# Patient Record
Sex: Male | Born: 1970 | Race: White | Hispanic: No | Marital: Married | Smoking: Never smoker
Health system: Southern US, Community
[De-identification: ages and names within clinical notes are randomized; demographics above are authoritative.]

## PROBLEM LIST (undated history)

## (undated) DIAGNOSIS — Z789 Other specified health status: Secondary | ICD-10-CM

## (undated) HISTORY — PX: WISDOM TOOTH EXTRACTION: SHX21

---

## 2018-11-11 DIAGNOSIS — J988 Other specified respiratory disorders: Secondary | ICD-10-CM | POA: Diagnosis not present

## 2018-11-11 DIAGNOSIS — Z20828 Contact with and (suspected) exposure to other viral communicable diseases: Secondary | ICD-10-CM | POA: Diagnosis not present

## 2019-06-28 DIAGNOSIS — H6121 Impacted cerumen, right ear: Secondary | ICD-10-CM | POA: Diagnosis not present

## 2019-09-30 ENCOUNTER — Ambulatory Visit (HOSPITAL_COMMUNITY)
Admission: EM | Admit: 2019-09-30 | Discharge: 2019-09-30 | Disposition: A | Discharge status: Home / Self Care | Payer: BC Managed Care – PPO | Attending: Emergency Medicine | Admitting: Emergency Medicine

## 2019-09-30 ENCOUNTER — Other Ambulatory Visit: Payer: Self-pay

## 2019-09-30 ENCOUNTER — Encounter (HOSPITAL_COMMUNITY): Payer: Self-pay

## 2019-09-30 DIAGNOSIS — J029 Acute pharyngitis, unspecified: Secondary | ICD-10-CM | POA: Insufficient documentation

## 2019-09-30 DIAGNOSIS — Z20822 Contact with and (suspected) exposure to covid-19: Secondary | ICD-10-CM | POA: Insufficient documentation

## 2019-09-30 DIAGNOSIS — R1013 Epigastric pain: Secondary | ICD-10-CM | POA: Diagnosis not present

## 2019-09-30 DIAGNOSIS — R0981 Nasal congestion: Secondary | ICD-10-CM

## 2019-09-30 LAB — COMPREHENSIVE METABOLIC PANEL
ALT: 20 U/L (ref 0–44)
AST: 21 U/L (ref 15–41)
Albumin: 4.2 g/dL (ref 3.5–5.0)
Alkaline Phosphatase: 61 U/L (ref 38–126)
Anion gap: 8 (ref 5–15)
BUN: 15 mg/dL (ref 6–20)
CO2: 23 mmol/L (ref 22–32)
Calcium: 9.2 mg/dL (ref 8.9–10.3)
Chloride: 107 mmol/L (ref 98–111)
Creatinine, Ser: 0.68 mg/dL (ref 0.61–1.24)
GFR calc Af Amer: >60 mL/min (ref >60)
GFR calc non Af Amer: >60 mL/min (ref >60)
Glucose, Bld: 97 mg/dL (ref 70–99)
Potassium: 3.9 mmol/L (ref 3.5–5.1)
Sodium: 138 mmol/L (ref 135–145)
Total Bilirubin: 1.1 mg/dL (ref 0.3–1.2)
Total Protein: 7.5 g/dL (ref 6.5–8.1)

## 2019-09-30 LAB — CBC
HCT: 42.4 % (ref 39.0–52.0)
Hemoglobin: 14.2 g/dL (ref 13.0–17.0)
MCH: 27.7 pg (ref 26.0–34.0)
MCHC: 33.5 g/dL (ref 30.0–36.0)
MCV: 82.8 fL (ref 80.0–100.0)
Platelets: 258 10*3/uL (ref 150–400)
RBC: 5.12 MIL/uL (ref 4.22–5.81)
RDW: 11.7 % (ref 11.5–15.5)
WBC: 9.1 10*3/uL (ref 4.0–10.5)
nRBC: 0 % (ref 0.0–0.2)

## 2019-09-30 LAB — LIPASE, BLOOD: Lipase: 29 U/L (ref 11–51)

## 2019-09-30 LAB — SARS CORONAVIRUS 2 (TAT 6-24 HRS): SARS Coronavirus 2: NEGATIVE

## 2019-09-30 MED ORDER — OMEPRAZOLE 20 MG PO CPDR
20.0000 mg | DELAYED_RELEASE_CAPSULE | Freq: Two times a day (BID) | ORAL | 0 refills | Status: DC
Start: 2019-09-30 — End: 2019-10-24

## 2019-09-30 MED ORDER — ALUM & MAG HYDROXIDE-SIMETH 400-400-40 MG/5ML PO SUSP: 15.0000 mL | Freq: Four times a day (QID) | ORAL | 0 refills | Status: DC | PRN

## 2019-09-30 MED ORDER — DICYCLOMINE HCL 20 MG PO TABS
20.0000 mg | ORAL_TABLET | Freq: Three times a day (TID) | ORAL | 0 refills | Status: DC
Start: 2019-09-30 — End: 2021-06-10

## 2019-09-30 NOTE — Discharge Instructions (Addendum)
Blood work pending- I will call only if abnormal Begin omeprazole twice daily for the next 2 weeks for acid prevention Supplement with Maalox or Pepcid over-the-counter for more immediate symptom relief May also try Bentyl before meals and bedtime to help with cramping/pain  If symptoms persisting I recommend you following up with primary care to have ultrasound of your abdomen to further evaluate your gallbladder  If symptoms worsening, not easing off, developing fever, please go to emergency room for more emergent imaging

## 2019-09-30 NOTE — ED Triage Notes (Signed)
Patient reports upper abdominal pain, which gets worse laying on his right side, sore throat, nasal congestion, and vomiting x3 days. Reports vomiting makes him feel better.

## 2019-10-01 NOTE — ED Provider Notes (Signed)
[facility]    CSN: [id number] Arrival date & time: 09/30/19  1014      History   Chief Complaint Chief Complaint  Patient presents with  . Abdominal Pain  . Nasal Congestion  . Sore Throat  . Emesis    HPI Ian Ramirez is a 49 y.o. male today does not past medical history presenting today for evaluation of abdominal pain.  Patient states that beginning Tuesday he began to have discomfort in his upper abdomen.  He initially believed this to be indigestion or gas.  He was feeling his symptoms worse after eating and having a bloating sensation.  Symptoms are slightly more prominent on the right side.  He has also felt nauseous and has felt the need to force himself to vomit.  Feels better after vomiting.  He denies any diarrhea or change in bowel movements.  Reports that the pain has woken him up.  Does admit he feels his symptoms are worse after eating fatty/greasy foods.  He does report some mild associated URI symptoms such as sore throat and some congestion, but he has attributed this to his allergies and does not feel this is related to his symptoms in the stomach.  Currently without pain.  HPI  History reviewed. No pertinent past medical history.  There are no problems to display for this patient.   History reviewed. No pertinent surgical history.     Home Medications    Prior to Admission medications   Medication Sig Start Date End Date Taking? Authorizing Provider  alum & mag hydroxide-simeth (MAALOX PLUS) 400-400-40 MG/5ML suspension Take 15 mLs by mouth every 6 (six) hours as needed for indigestion. 09/30/19   Duane Trias C, PA-C  dicyclomine (BENTYL) 20 MG tablet Take 1 tablet (20 mg total) by mouth 4 (four) times daily -  before meals and at bedtime. 09/30/19   Analisia Kingsford C, PA-C  omeprazole (PRILOSEC) 20 MG capsule Take 1 capsule (20 mg total) by mouth 2 (two) times daily before a meal for 15 days. 09/30/19 10/15/19  Sahithi Ordoyne, Elesa Hacker, PA-C    Family  History No family history on file.  Social History Social History   Tobacco Use  . Smoking status: Never Smoker  . Smokeless tobacco: Never Used  Substance Use Topics  . Alcohol use: Yes    Alcohol/week: 1.0 standard drinks    Types: 1 Standard drinks or equivalent per week  . Drug use: Not on file     Allergies   Patient has no known allergies.   Review of Systems Review of Systems  Constitutional: Negative for activity change, appetite change, chills, fatigue and fever.  HENT: Positive for congestion, rhinorrhea and sore throat. Negative for ear pain, sinus pressure and trouble swallowing.   Eyes: Negative for discharge and redness.  Respiratory: Negative for cough, chest tightness and shortness of breath.   Cardiovascular: Negative for chest pain.  Gastrointestinal: Positive for abdominal pain, nausea and vomiting. Negative for diarrhea.  Musculoskeletal: Negative for myalgias.  Skin: Negative for rash.  Neurological: Negative for dizziness, light-headedness and headaches.     Physical Exam Triage Vital Signs ED Triage Vitals  Enc Vitals Group     BP 09/30/19 1053 134/84     Pulse Rate 09/30/19 1053 75     Resp 09/30/19 1053 16     Temp 09/30/19 1053 98.5 F (36.9 C)     Temp Source 09/30/19 1053 Oral     SpO2 09/30/19 1053 99 %  Weight --      Height --      Head Circumference --      Peak Flow --      Pain Score 09/30/19 1052 0     Pain Loc --      Pain Edu? --      Excl. in GC? --    No data found.  Updated Vital Signs BP 134/84 (BP Location: Left Arm)   Pulse 75   Temp 98.5 F (36.9 C) (Oral)   Resp 16   SpO2 99%   Visual Acuity Right Eye Distance:   Left Eye Distance:   Bilateral Distance:    Right Eye Near:   Left Eye Near:    Bilateral Near:     Physical Exam Vitals and nursing note reviewed.  Constitutional:      Appearance: He is well-developed.     Comments: No acute distress  HENT:     Head: Normocephalic and atraumatic.      Nose: Nose normal.     Mouth/Throat:     Comments: Oral mucosa pink and moist, no tonsillar enlargement or exudate. Posterior pharynx patent and nonerythematous, no uvula deviation or swelling. Normal phonation. Eyes:     Conjunctiva/sclera: Conjunctivae normal.  Cardiovascular:     Rate and Rhythm: Normal rate.  Pulmonary:     Effort: Pulmonary effort is normal. No respiratory distress.     Comments: Breathing comfortably at rest, CTABL, no wheezing, rales or other adventitious sounds auscultated  Abdominal:     General: There is no distension.  Musculoskeletal:        General: Normal range of motion.     Cervical back: Neck supple.  Skin:    General: Skin is warm and dry.  Neurological:     Mental Status: He is alert and oriented to person, place, and time.      UC Treatments / Results  Labs (all labs ordered are listed, but only abnormal results are displayed) Labs Reviewed  SARS CORONAVIRUS 2 (TAT 6-24 HRS)  CBC  COMPREHENSIVE METABOLIC PANEL  LIPASE, BLOOD    EKG   Radiology No results found.  Procedures Procedures (including critical care time)  Medications Ordered in UC Medications - No data to display  Initial Impression / Assessment and Plan / UC Course  I have reviewed the triage vital signs and the nursing notes.  Pertinent labs & imaging results that were available during my care of the patient were reviewed by me and considered in my medical decision making (see chart for details).     Abdominal pain and history concerning for possible gallbladder etiology versus GERD/gastritis.  Initiating on PPI along with using Pepcid or Maalox for more immediate relief.  Provided Bentyl to try before meals and bedtime or to use as needed for pain/cramping.  Checking blood work, will call if abnormal and suggesting need for further work-up in emergency room.  Otherwise discussed establishing care with PCP in order to have ultrasound of right upper quadrant if  symptoms persisting.  Also a sore throat and congestion, seems unrelated to GI symptoms.  Covid PCR pending.  Continue symptomatic and supportive care.  Discussed strict return precautions. Patient verbalized understanding and is agreeable with plan.  Final Clinical Impressions(s) / UC Diagnoses   Final diagnoses:  Epigastric pain  Nasal congestion     Discharge Instructions     Blood work pending- I will call only if abnormal Begin omeprazole twice daily for the next  2 weeks for acid prevention Supplement with Maalox or Pepcid over-the-counter for more immediate symptom relief May also try Bentyl before meals and bedtime to help with cramping/pain  If symptoms persisting I recommend you following up with primary care to have ultrasound of your abdomen to further evaluate your gallbladder  If symptoms worsening, not easing off, developing fever, please go to emergency room for more emergent imaging   ED Prescriptions    Medication Sig Dispense Auth. Provider   omeprazole (PRILOSEC) 20 MG capsule Take 1 capsule (20 mg total) by mouth 2 (two) times daily before a meal for 15 days. 30 capsule Mardelle Pandolfi C, PA-C   alum & mag hydroxide-simeth (MAALOX PLUS) 400-400-40 MG/5ML suspension Take 15 mLs by mouth every 6 (six) hours as needed for indigestion. 355 mL Delorus Langwell C, PA-C   dicyclomine (BENTYL) 20 MG tablet Take 1 tablet (20 mg total) by mouth 4 (four) times daily -  before meals and at bedtime. 20 tablet Lubna Stegeman, [place] C, PA-C     PDMP not reviewed this encounter.   Lew Dawes, [place] 10/01/19 [phone]

## 2019-10-24 ENCOUNTER — Encounter: Payer: Self-pay | Admitting: Medical

## 2019-10-24 ENCOUNTER — Other Ambulatory Visit: Payer: Self-pay

## 2019-10-24 ENCOUNTER — Ambulatory Visit: Payer: BC Managed Care – PPO | Admitting: Medical

## 2019-10-24 VITALS — BP 111/69 | HR 71 | Resp 18 | Ht 69.0 in | Wt 175.2 lb

## 2019-10-24 DIAGNOSIS — R1011 Right upper quadrant pain: Secondary | ICD-10-CM

## 2019-10-24 LAB — CBC WITH DIFFERENTIAL/PLATELET
Basophils Absolute: 0 10*3/uL (ref 0.0–0.1)
Basophils Relative: 0.5 % (ref 0.0–3.0)
Eosinophils Absolute: 0.3 10*3/uL (ref 0.0–0.7)
Eosinophils Relative: 3.4 % (ref 0.0–5.0)
HCT: 38.4 % — ABNORMAL LOW (ref 39.0–52.0)
Hemoglobin: 13.4 g/dL (ref 13.0–17.0)
Lymphocytes Relative: 18.6 % (ref 12.0–46.0)
Lymphs Abs: 1.7 10*3/uL (ref 0.7–4.0)
MCHC: 35 g/dL (ref 30.0–36.0)
MCV: 82.3 fl (ref 78.0–100.0)
Monocytes Absolute: 0.9 10*3/uL (ref 0.1–1.0)
Monocytes Relative: 9.7 % (ref 3.0–12.0)
Neutro Abs: 6.1 10*3/uL (ref 1.4–7.7)
Neutrophils Relative %: 67.8 % (ref 43.0–77.0)
Platelets: 251 10*3/uL (ref 150.0–400.0)
RBC: 4.67 Mil/uL (ref 4.22–5.81)
RDW: 12.6 % (ref 11.5–15.5)
WBC: 9.1 10*3/uL (ref 4.0–10.5)

## 2019-10-24 LAB — COMPREHENSIVE METABOLIC PANEL
ALT: 14 U/L (ref 0–53)
AST: 16 U/L (ref 0–37)
Albumin: 4.5 g/dL (ref 3.5–5.2)
Alkaline Phosphatase: 74 U/L (ref 39–117)
BUN: 11 mg/dL (ref 6–23)
CO2: 30 mEq/L (ref 19–32)
Calcium: 9.3 mg/dL (ref 8.4–10.5)
Chloride: 102 mEq/L (ref 96–112)
Creatinine, Ser: 0.77 mg/dL (ref 0.40–1.50)
GFR: 107.73 mL/min (ref >60.00)
Glucose, Bld: 99 mg/dL (ref 70–99)
Potassium: 4.2 mEq/L (ref 3.5–5.1)
Sodium: 136 mEq/L (ref 135–145)
Total Bilirubin: 1 mg/dL (ref 0.2–1.2)
Total Protein: 7 g/dL (ref 6.0–8.3)

## 2019-10-24 LAB — LIPASE: Lipase: 22 U/L (ref 11.0–59.0)

## 2019-10-24 MED ORDER — OMEPRAZOLE 20 MG PO CPDR: 20.0000 mg | DELAYED_RELEASE_CAPSULE | Freq: Two times a day (BID) | ORAL | 0 refills | Status: DC

## 2019-10-24 NOTE — Patient Instructions (Addendum)
For your hx of abdomen pain mostly rt upper quadrant with some epigastric will get cbc, cmp, lipase and h pylori breath test.  Eat healthy as you have been and refilled omeprazole.  US abdomen order placed to evaluate gallbladder. Hopefully will get you scheduled for this week.  Follow up 3 weeks for cpe/wellness or sooner as needed

## 2019-10-24 NOTE — Progress Notes (Signed)
Subjective:    Patient ID: Ian Ramirez, male    DOB: 1971-02-07, 49 y.o.   MRN: [id number]  HPI  Pt in for first time.   He states no former pcp.  Pt works at [employer]. Works 12,000-13000 steps at work and walks [employer] [place] 2 miles 3 days a week. States occasional/rare alcohol use. Non smoker.   No chronic medical problems.  Pt comes in for abdomen pain since end of April. He points to ruq area pain. Sometimes pain after eating. One time when had most pain was after greasy fatty food. Pt has been avoid fatty and acidic foods. Sunday had most severe pain for about 3 hours. He did dry heave about 8 times.  Pt went to UC early in month.   HPI Ian Ramirez is a 49 y.o. male today does not past medical history presenting today for evaluation of abdominal pain.  Patient states that beginning Tuesday he began to have discomfort in his upper abdomen.  He initially believed this to be indigestion or gas.  He was feeling his symptoms worse after eating and having a bloating sensation.  Symptoms are slightly more prominent on the right side.  He has also felt nauseous and has felt the need to force himself to vomit.  Feels better after vomiting.  He denies any diarrhea or change in bowel movements.  Reports that the pain has woken him up.  Does admit he feels his symptoms are worse after eating fatty/greasy foods.  He does report some mild associated URI symptoms such as sore throat and some congestion, but he has attributed this to his allergies and does not feel this is related to his symptoms in the stomach.  Currently without pain.  A/P from UC Abdominal pain and history concerning for possible gallbladder etiology versus GERD/gastritis.  Initiating on PPI along with using Pepcid or Maalox for more immediate relief.  Provided Bentyl to try before meals and bedtime or to use as needed for pain/cramping.  Checking blood work, will call if abnormal and suggesting need for further work-up in  emergency room.  Otherwise discussed establishing care with PCP in order to have ultrasound of right upper quadrant if symptoms persisting.   Pt states he took omeprazole, maalox and bentyl for 2 weeks.   At present he has minimal pain. This morning only mild soreness. During time he was on medication he did not have pain. But was following strict diet as well.       Review of Systems  Constitutional: Negative for chills, fatigue and fever.  Respiratory: Negative for cough, chest tightness, shortness of breath and wheezing.   Cardiovascular: Negative for chest pain and palpitations.  Gastrointestinal: Positive for abdominal pain. Negative for abdominal distention, blood in stool, constipation, diarrhea, nausea and vomiting.  Genitourinary: Negative for dysuria and frequency.  Musculoskeletal: Negative for back pain.  Skin: Negative for rash.  Neurological: Negative for dizziness and headaches.  Hematological: Negative for adenopathy.  Psychiatric/Behavioral: Negative for behavioral problems and confusion.    No past medical history on file.   Social History   Socioeconomic History  . Marital status: Married    Spouse name: Not on file  . Number of children: Not on file  . Years of education: Not on file  . Highest education level: Not on file  Occupational History  . Not on file  Tobacco Use  . Smoking status: Never Smoker  . Smokeless tobacco: Never Used  Substance and Sexual  Activity  . Alcohol use: Yes    Alcohol/week: 1.0 standard drinks    Types: 1 Standard drinks or equivalent per week  . Drug use: Not on file  . Sexual activity: Not on file  Other Topics Concern  . Not on file  Social History Narrative  . Not on file   Social Determinants of Health   Financial Resource Strain:   . Difficulty of Paying Living Expenses:   Food Insecurity:   . Worried About Programme researcher, broadcasting/film/video in the Last Year:   . Barista in the Last Year:   Transportation  Needs:   . Freight forwarder (Medical):   Marland Kitchen Lack of Transportation (Non-Medical):   Physical Activity:   . Days of Exercise per Week:   . Minutes of Exercise per Session:   Stress:   . Feeling of Stress :   Social Connections:   . Frequency of Communication with Friends and Family:   . Frequency of Social Gatherings with Friends and Family:   . Attends Religious Services:   . Active Member of Clubs or Organizations:   . Attends Banker Meetings:   Marland Kitchen Marital Status:   Intimate Partner Violence:   . Fear of Current or Ex-Partner:   . Emotionally Abused:   Marland Kitchen Physically Abused:   . Sexually Abused:     No past surgical history on file.  No family history on file.  No Known Allergies  Current Outpatient Medications on File Prior to Visit  Medication Sig Dispense Refill  . alum & mag hydroxide-simeth (MAALOX PLUS) 400-400-40 MG/5ML suspension Take 15 mLs by mouth every 6 (six) hours as needed for indigestion. (Patient not taking: Reported on 10/24/2019) 355 mL 0  . dicyclomine (BENTYL) 20 MG tablet Take 1 tablet (20 mg total) by mouth 4 (four) times daily -  before meals and at bedtime. (Patient not taking: Reported on 10/24/2019) 20 tablet 0   No current facility-administered medications on file prior to visit.    BP 111/69 (BP Location: Left Arm, Patient Position: Sitting, Cuff Size: Large)   Pulse 71   Resp 18   Ht 5\' 9"  (1.753 m)   Wt 175 lb 3.2 oz (79.5 kg)   SpO2 98%   BMI 25.87 kg/m       Objective:   Physical Exam  General- No acute distress. Pleasant patient. Neck- Full range of motion, no jvd Lungs- Clear, even and unlabored. Heart- regular rate and rhythm. Neurologic- CNII- XII grossly intact. Abdomen- soft, nt, nd, +bs, no rebound or guarding. Back- no cva tenderness.      Assessment & Plan:  For your hx of abdomen pain mostly rt upper quadrant with some epigastric will get cbc, cmp, lipase and h pylori breath test.  Eat healthy as  you have been and refilled omeprazole.  abdomen order placed. Hopefully will get you scheduled for this week.  Follow up 3 weeks for cpe/wellness or sooner as needed  [place], PA-C   Time spent with new  patient today was 30  minutes which consisted of discussing differential diagnoses, work up, treatment and documentation.

## 2019-10-25 ENCOUNTER — Ambulatory Visit (HOSPITAL_BASED_OUTPATIENT_CLINIC_OR_DEPARTMENT_OTHER)
Admission: RE | Admit: 2019-10-25 | Discharge: 2019-10-25 | Disposition: A | Discharge status: Home / Self Care | Payer: BC Managed Care – PPO | Source: Ambulatory Visit | Attending: Medical | Admitting: Medical

## 2019-10-25 ENCOUNTER — Telehealth: Payer: Self-pay | Admitting: Medical

## 2019-10-25 DIAGNOSIS — K8020 Calculus of gallbladder without cholecystitis without obstruction: Secondary | ICD-10-CM | POA: Diagnosis not present

## 2019-10-25 DIAGNOSIS — R1011 Right upper quadrant pain: Secondary | ICD-10-CM | POA: Diagnosis not present

## 2019-10-25 LAB — H. PYLORI BREATH TEST: H. pylori Breath Test: NOT DETECTED

## 2019-10-25 NOTE — Telephone Encounter (Signed)
Opened to review 

## 2019-11-02 ENCOUNTER — Encounter: Payer: Self-pay | Admitting: Medical

## 2019-11-14 ENCOUNTER — Other Ambulatory Visit: Payer: Self-pay

## 2019-11-14 ENCOUNTER — Ambulatory Visit: Payer: BC Managed Care – PPO | Admitting: Medical

## 2019-11-14 ENCOUNTER — Encounter: Payer: Self-pay | Admitting: Medical

## 2019-11-14 VITALS — BP 100/68 | HR 69 | Temp 97.0°F | Resp 18 | Ht 69.0 in | Wt 171.0 lb

## 2019-11-14 DIAGNOSIS — K8020 Calculus of gallbladder without cholecystitis without obstruction: Secondary | ICD-10-CM | POA: Diagnosis not present

## 2019-11-14 DIAGNOSIS — F439 Reaction to severe stress, unspecified: Secondary | ICD-10-CM | POA: Diagnosis not present

## 2019-11-14 DIAGNOSIS — Z1211 Encounter for screening for malignant neoplasm of colon: Secondary | ICD-10-CM

## 2019-11-14 NOTE — Progress Notes (Signed)
Subjective:    Patient ID: Ian Ramirez, male    DOB: 1970-12-30, 49 y.o.   MRN: [id number]  HPI  Pt in for follow up.  Pt has hx of abdomen pain. See last note. Pt has appointment with surgeon coming up on November 24, 2019.  Pt does not want to have surgery.   Pt states he does not have pain after eating recently. Avoid fried foods, oil and butter. Even when does not eat strict does not have pain.  His ultrasound shows below. IMPRESSION: The gallbladder is filled with multiple small stones and sludge. No secondary signs to suggest acute cholecystitis.  No acute process within the abdomen  Pt stopped prilosec and not using bentyl.   Pt has some low level stress and anxiety at times. Mom tended to be anxious. Pt thinks not enough to need meds.     Review of Systems  Constitutional: Negative for chills, fatigue and fever.  Respiratory: Negative for cough, chest tightness, shortness of breath, wheezing and stridor.   Cardiovascular: Negative for chest pain and palpitations.  Gastrointestinal: Negative for abdominal pain, nausea and vomiting.  Genitourinary: Negative for dysuria, frequency, hematuria, penile swelling, scrotal swelling and urgency.  Musculoskeletal: Negative for neck pain.  Skin: Negative for rash.  Neurological: Negative for facial asymmetry and headaches.  Hematological: Negative for adenopathy. Does not bruise/bleed easily.  Psychiatric/Behavioral: Negative for behavioral problems, confusion, sleep disturbance and suicidal ideas. The patient is nervous/anxious.     No past medical history on file.   Social History   Socioeconomic History  . Marital status: Married    Spouse name: Not on file  . Number of children: Not on file  . Years of education: Not on file  . Highest education level: Not on file  Occupational History  . Not on file  Tobacco Use  . Smoking status: Never Smoker  . Smokeless tobacco: Never Used  Vaping Use  . Vaping Use: Never  used  Substance and Sexual Activity  . Alcohol use: Yes    Alcohol/week: 1.0 standard drink    Types: 1 Standard drinks or equivalent per week    Comment: rare.  . Drug use: Not on file  . Sexual activity: Yes  Other Topics Concern  . Not on file  Social History Narrative  . Not on file   Social Determinants of Health   Financial Resource Strain:   . Difficulty of Paying Living Expenses:   Food Insecurity:   . Worried About Programme researcher, broadcasting/film/video in the Last Year:   . Barista in the Last Year:   Transportation Needs:   . Freight forwarder (Medical):   Marland Kitchen Lack of Transportation (Non-Medical):   Physical Activity:   . Days of Exercise per Week:   . Minutes of Exercise per Session:   Stress:   . Feeling of Stress :   Social Connections:   . Frequency of Communication with Friends and Family:   . Frequency of Social Gatherings with Friends and Family:   . Attends Religious Services:   . Active Member of Clubs or Organizations:   . Attends Banker Meetings:   Marland Kitchen Marital Status:   Intimate Partner Violence:   . Fear of Current or Ex-Partner:   . Emotionally Abused:   Marland Kitchen Physically Abused:   . Sexually Abused:     No past surgical history on file.  No family history on file.  No Known Allergies  Current  Outpatient Medications on File Prior to Visit  Medication Sig Dispense Refill  . alum & mag hydroxide-simeth (MAALOX PLUS) 400-400-40 MG/5ML suspension Take 15 mLs by mouth every 6 (six) hours as needed for indigestion. (Patient not taking: Reported on 10/24/2019) 355 mL 0  . dicyclomine (BENTYL) 20 MG tablet Take 1 tablet (20 mg total) by mouth 4 (four) times daily -  before meals and at bedtime. (Patient not taking: Reported on 10/24/2019) 20 tablet 0  . omeprazole (PRILOSEC) 20 MG capsule Take 1 capsule (20 mg total) by mouth 2 (two) times daily before a meal. (Patient not taking: Reported on 11/14/2019) 60 capsule 0   No current facility-administered  medications on file prior to visit.    BP 93/63 (BP Location: Left Arm, Patient Position: Sitting, Cuff Size: Normal)   Pulse 69   Temp (!) 97 F (36.1 C) (Temporal)   Resp 18   Ht 5\' 9"  (1.753 m)   Wt 171 lb (77.6 kg)   SpO2 100%   BMI 25.25 kg/m      Objective:   Physical Exam  General Mental Status- Alert. General Appearance- Not in acute distress.   Skin General: Color- Normal Color. Moisture- Normal Moisture.  Neck Carotid Arteries- Normal color. Moisture- Normal Moisture. No carotid bruits. No JVD.  Chest and Lung Exam Auscultation: Breath Sounds:-Normal.  Cardiovascular Auscultation:Rythm- Regular. Murmurs & Other Heart Sounds:Auscultation of the heart reveals- No Murmurs.  Abdomen Inspection:-Inspeection Normal. Palpation/Percussion:Note:No mass. Palpation and Percussion of the abdomen reveal- Non Tender, Non Distended + BS, no rebound or guarding.    Neurologic Cranial Nerve exam:- CN III-XII intact(No nystagmus), symmetric smile.  Strength:- 5/5 equal and symmetric strength both upper and lower extremities.      Assessment & Plan:  For gallstones and sludge do keep appointment with general surgeon and keep eating healthy diet.  For stress and anxiety if you feel can't control let us know and can offer low dose medication if needed/desired.  Did go ahead and put in referral to GI MD for screening colonoscopy.  Follow up 6-8 weeks or as needed  Mackie Pai, PA-C   Time spent with patient today was  23 minutes which consisted of chart review, discussing diagnosis, referral,  treatment and documentation.

## 2019-11-14 NOTE — Patient Instructions (Signed)
For gallstones and sludge do keep appointment with general surgeon and keep eating healthy diet.  For stress and anxiety if you feel can't control let us know and can offer low dose medication if needed/desired.  Did go ahead and put in referral to GI MD for screening colonoscopy.  Follow up 6-8 weeks or as needed

## 2019-11-15 ENCOUNTER — Other Ambulatory Visit: Payer: Self-pay | Admitting: Medical

## 2019-11-24 ENCOUNTER — Encounter: Payer: Self-pay | Admitting: Gastroenterology

## 2019-11-24 DIAGNOSIS — K8020 Calculus of gallbladder without cholecystitis without obstruction: Secondary | ICD-10-CM | POA: Diagnosis not present

## 2019-11-28 ENCOUNTER — Other Ambulatory Visit: Payer: Self-pay | Admitting: Medical

## 2019-12-26 ENCOUNTER — Other Ambulatory Visit: Payer: Self-pay

## 2019-12-26 ENCOUNTER — Ambulatory Visit (AMBULATORY_SURGERY_CENTER): Payer: Self-pay

## 2019-12-26 VITALS — Ht 69.0 in | Wt 174.0 lb

## 2019-12-26 DIAGNOSIS — Z1211 Encounter for screening for malignant neoplasm of colon: Secondary | ICD-10-CM

## 2019-12-26 MED ORDER — SUTAB 1479-225-188 MG PO TABS
12.0000 | ORAL_TABLET | ORAL | 0 refills | Status: DC
Start: 2019-12-26 — End: 2020-01-23

## 2019-12-26 NOTE — Progress Notes (Signed)
No egg or soy allergy known to patient  No issues with past sedation with any surgeries or procedures no intubation problems in the past  No diet pills per patient No home 02 use per patient  No blood thinners per patient  Pt denies issues with constipation  No A fib or A flutter  EMMI video to pt or MyChart  COVID 19 guidelines implemented in [facility] today   Sutab Code and Coupon given to pt in [facility] today   Due to the COVID-19 pandemic we are asking patients to follow these guidelines. Please only bring one care partner. Please be aware that your care partner may wait in the car in the parking lot or if they feel like they will be too hot to wait in the car, they may wait in the lobby on the 4th floor. All care partners are required to wear a mask the entire time (we do not have any that we can provide them), they need to practice social distancing, and we will do a Covid check for all patient's and care partners when you arrive. Also we will check their temperature and your temperature. If the care partner waits in their car they need to stay in the parking lot the entire time and we will call them on their cell phone when the patient is ready for discharge so they can bring the car to the front of the building. Also all patient's will need to wear a mask into building.

## 2019-12-29 ENCOUNTER — Encounter: Payer: Self-pay | Admitting: Gastroenterology

## 2020-01-23 ENCOUNTER — Ambulatory Visit (AMBULATORY_SURGERY_CENTER): Payer: BC Managed Care – PPO | Admitting: Gastroenterology

## 2020-01-23 ENCOUNTER — Encounter: Payer: Self-pay | Admitting: Gastroenterology

## 2020-01-23 ENCOUNTER — Other Ambulatory Visit: Payer: Self-pay

## 2020-01-23 VITALS — BP 99/61 | HR 58 | Temp 98.0°F | Resp 16 | Ht 69.0 in | Wt 174.0 lb

## 2020-01-23 DIAGNOSIS — Z1211 Encounter for screening for malignant neoplasm of colon: Secondary | ICD-10-CM | POA: Diagnosis not present

## 2020-01-23 MED ORDER — SODIUM CHLORIDE 0.9 % IV SOLN: 500.0000 mL | Freq: Once | INTRAVENOUS | Status: DC

## 2020-01-23 NOTE — Progress Notes (Signed)
Pt's states no medical or surgical changes since previsit or office visit.  VS CW  

## 2020-01-23 NOTE — Patient Instructions (Signed)
Handout for hemorrhoids given.  YOU HAD AN ENDOSCOPIC PROCEDURE TODAY AT THE [facility]:   Refer to the procedure report that was given to you for any specific questions about what was found during the examination.  If the procedure report does not answer your questions, please call your gastroenterologist to clarify.  If you requested that your care partner not be given the details of your procedure findings, then the procedure report has been included in a sealed envelope for you to review at your convenience later.  YOU SHOULD EXPECT: Some feelings of bloating in the abdomen. Passage of more gas than usual.  Walking can help get rid of the air that was put into your GI tract during the procedure and reduce the bloating. If you had a lower endoscopy (such as a colonoscopy or flexible sigmoidoscopy) you may notice spotting of blood in your stool or on the toilet paper. If you underwent a bowel prep for your procedure, you may not have a normal bowel movement for a few days.  Please Note:  You might notice some irritation and congestion in your nose or some drainage.  This is from the oxygen used during your procedure.  There is no need for concern and it should clear up in a day or so.  SYMPTOMS TO REPORT IMMEDIATELY:   Following lower endoscopy (colonoscopy or flexible sigmoidoscopy):  Excessive amounts of blood in the stool  Significant tenderness or worsening of abdominal pains  Swelling of the abdomen that is new, acute  Fever of 100F or higher  For urgent or emergent issues, a gastroenterologist can be reached at any hour by calling [phone]. Do not use MyChart messaging for urgent concerns.    DIET:  We do recommend a small meal at first, but then you may proceed to your regular diet.  Drink plenty of fluids but you should avoid alcoholic beverages for 24 hours.  ACTIVITY:  You should plan to take it easy for the rest of today and you should NOT DRIVE or use heavy  machinery until tomorrow (because of the sedation medicines used during the test).    FOLLOW UP: Our staff will call the number listed on your records 48-72 hours following your procedure to check on you and address any questions or concerns that you may have regarding the information given to you following your procedure. If we do not reach you, we will leave a message.  We will attempt to reach you two times.  During this call, we will ask if you have developed any symptoms of COVID 19. If you develop any symptoms (ie: fever, flu-like symptoms, shortness of breath, cough etc.) before then, please call [phone].  If you test positive for Covid 19 in the 2 weeks post procedure, please call and report this information to us.    If any biopsies were taken you will be contacted by phone or by letter within the next 1-3 weeks.  Please call us at [phone] if you have not heard about the biopsies in 3 weeks.    SIGNATURES/CONFIDENTIALITY: You and/or your care partner have signed paperwork which will be entered into your electronic medical record.  These signatures attest to the fact that that the information above on your After Visit Summary has been reviewed and is understood.  Full responsibility of the confidentiality of this discharge information lies with you and/or your care-partner. 

## 2020-01-23 NOTE — Progress Notes (Signed)
A and O x3. Report to RN. Tolerated MAC anesthesia well.

## 2020-01-23 NOTE — Op Note (Signed)
[name] Patient Name: Ian Ramirez Procedure Date: 01/23/2020 2:40 PM MRN: [id number] Endoscopist: Napoleon Form , MD Age: 49 Referring MD:  Date of Birth: Jun 06, 1970 Gender: Male Account #: [id number] Procedure:                Colonoscopy Indications:              Screening for colorectal malignant neoplasm Medicines:                Monitored Anesthesia Care Procedure:                Pre-Anesthesia Assessment:                           - Prior to the procedure, a History and Physical                            was performed, and patient medications and                            allergies were reviewed. The patient's tolerance of                            previous anesthesia was also reviewed. The risks                            and benefits of the procedure and the sedation                            options and risks were discussed with the patient.                            All questions were answered, and informed consent                            was obtained. Prior Anticoagulants: The patient has                            taken no previous anticoagulant or antiplatelet                            agents. ASA Grade Assessment: II - A patient with                            mild systemic disease. After reviewing the risks                            and benefits, the patient was deemed in                            satisfactory condition to undergo the procedure.                           After obtaining informed consent, the colonoscope  was passed under direct vision. Throughout the                            procedure, the patient's blood pressure, pulse, and                            oxygen saturations were monitored continuously. The                            Colonoscope was introduced through the anus and                            advanced to the the cecum, identified by                            appendiceal orifice and  ileocecal valve. The                            colonoscopy was performed without difficulty. The                            patient tolerated the procedure well. The quality                            of the bowel preparation was excellent. The                            ileocecal valve, appendiceal orifice, and rectum                            were photographed. Scope In: 2:52:21 PM Scope Out: 3:11:51 PM Scope Withdrawal Time: 0 hours 15 minutes 0 seconds  Total Procedure Duration: 0 hours 19 minutes 30 seconds  Findings:                 The perianal and digital rectal examinations were                            normal.                           Non-bleeding internal hemorrhoids were found during                            retroflexion. The hemorrhoids were small.                           The exam was otherwise without abnormality. Complications:            No immediate complications. Estimated Blood Loss:     Estimated blood loss: none. Impression:               - Non-bleeding internal hemorrhoids.                           - The examination was otherwise normal.                           -  No specimens collected. Recommendation:           - Patient has a contact number available for                            emergencies. The signs and symptoms of potential                            delayed complications were discussed with the                            patient. Return to normal activities tomorrow.                            Written discharge instructions were provided to the                            patient.                           - Resume previous diet.                           - Continue present medications.                           - Repeat colonoscopy in 10 years for screening                            purposes. Napoleon Form, MD 01/23/2020 3:19:35 PM This report has been signed electronically.

## 2020-01-25 ENCOUNTER — Telehealth: Payer: Self-pay | Admitting: *Deleted

## 2020-01-25 ENCOUNTER — Telehealth: Payer: Self-pay

## 2020-01-25 NOTE — Telephone Encounter (Signed)
  Follow up Call-  Call back number 01/23/2020  Post procedure Call Back phone  # [phone]  Permission to leave phone message Yes     Left message

## 2020-01-25 NOTE — Telephone Encounter (Signed)
First follow up call attempt.  Message left on vm. 

## 2020-10-02 IMAGING — US US ABDOMEN COMPLETE
1 series · 14 of 25 positions shown · non-contrast
Comparison: None.

CLINICAL DATA: Right upper quadrant abdominal pain.

EXAM:
ABDOMEN ULTRASOUND COMPLETE

[Series 1: us abdomen complete · 14 of 101 slices shown]
[im 1/101]
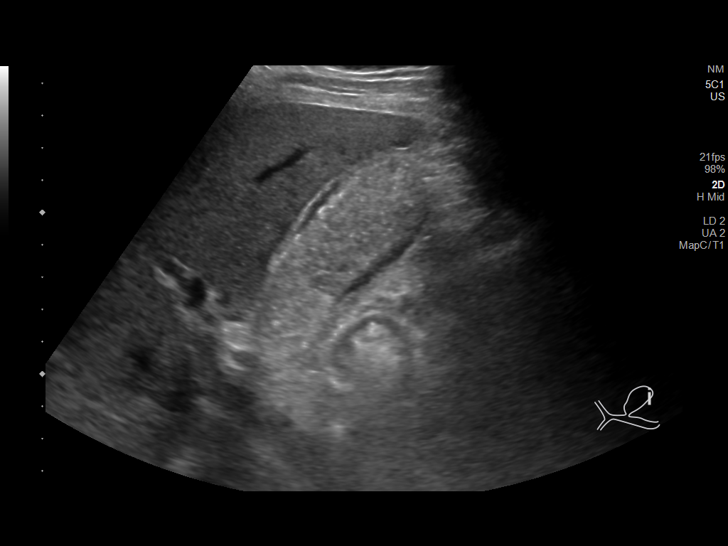
[im 9/101]
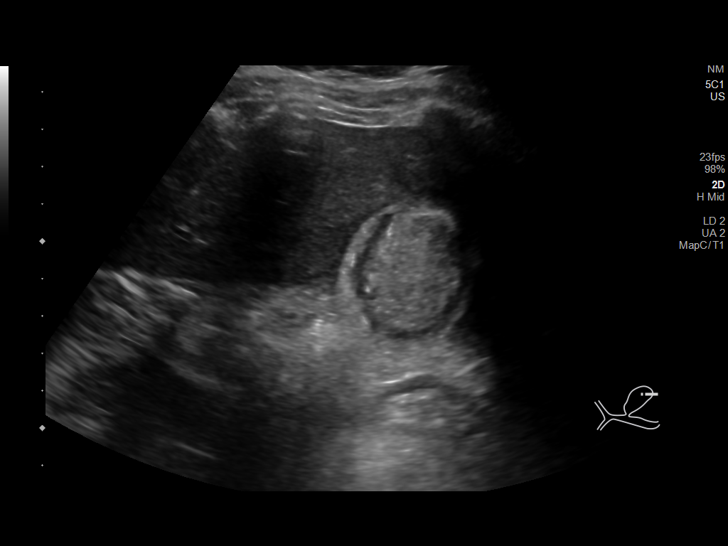
[im 17/101]
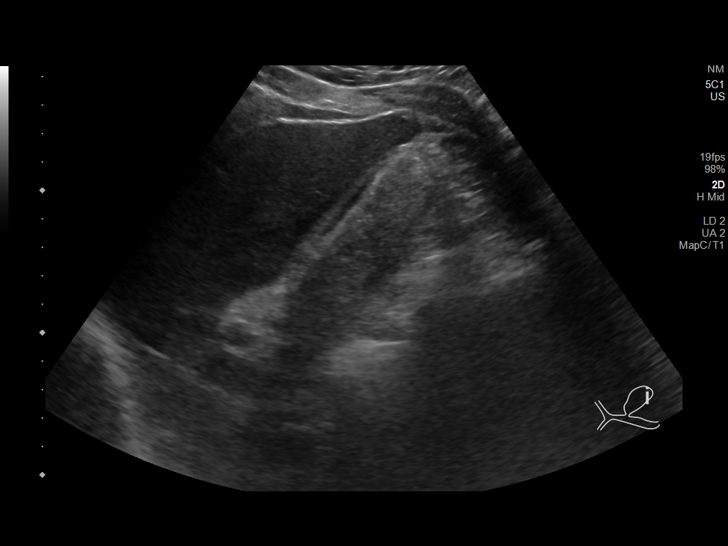
[im 26/101]
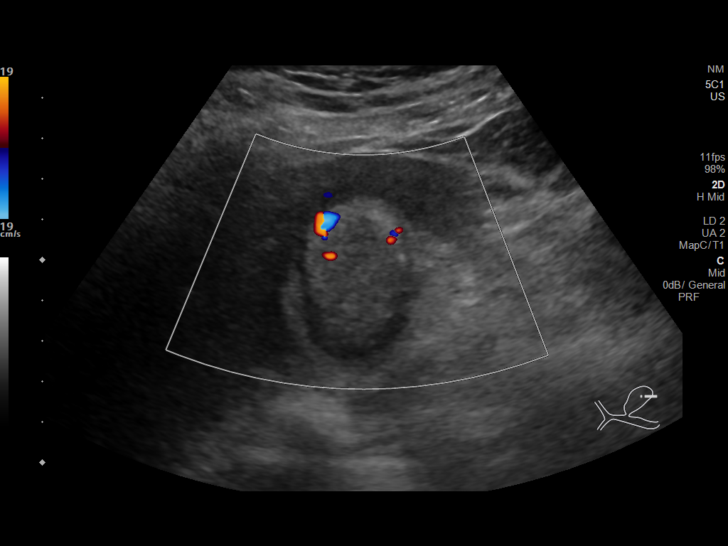
[im 34/101]
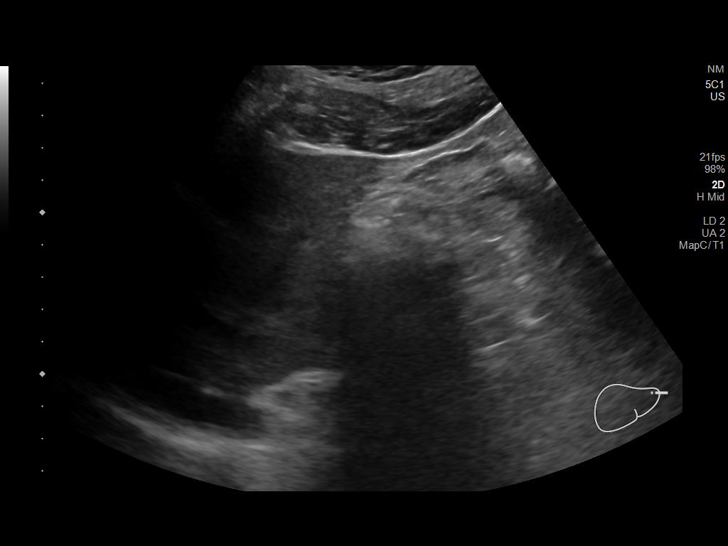
[im 38/101]
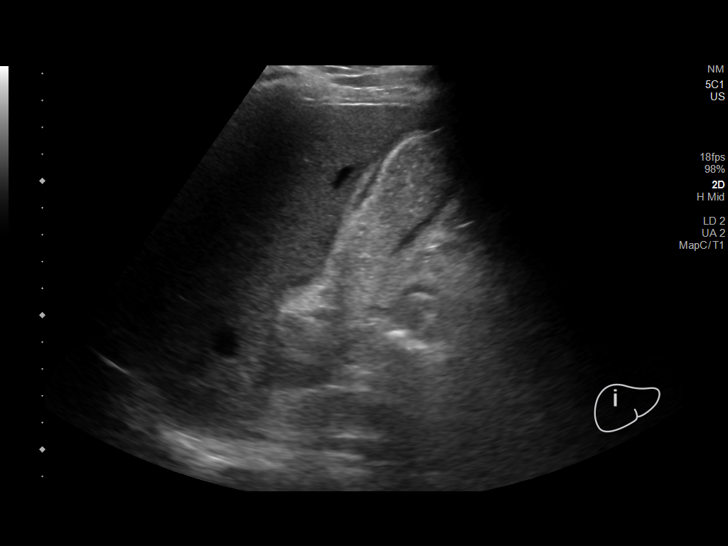
[im 46/101]
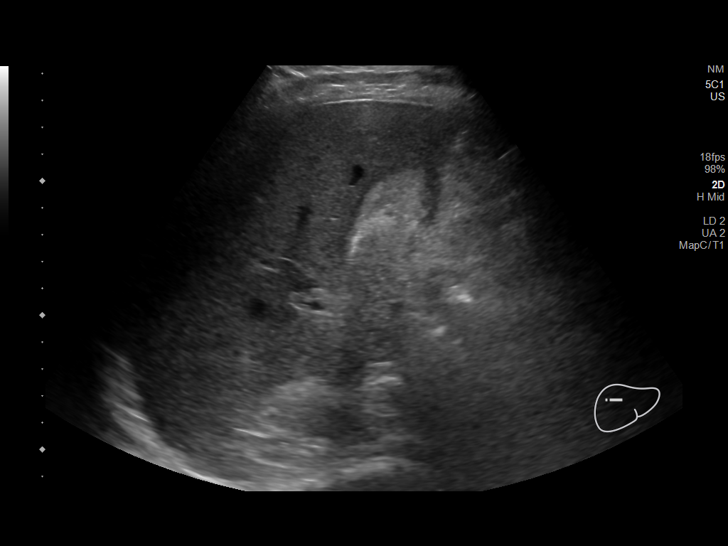
[im 55/101]
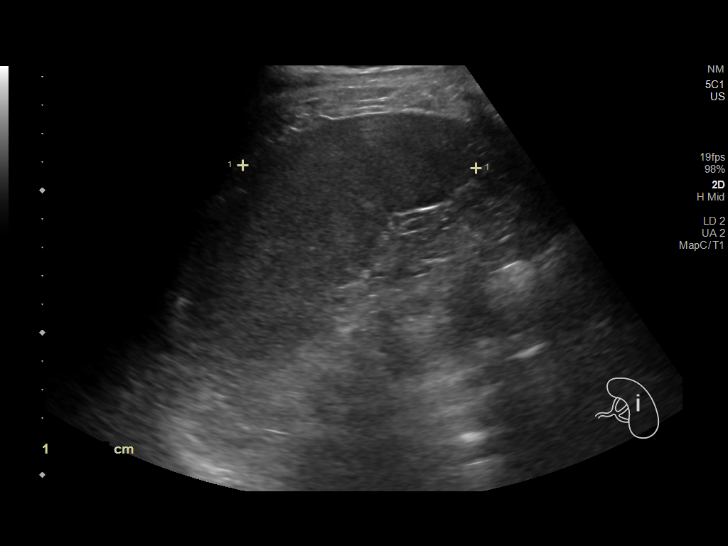
[im 63/101]
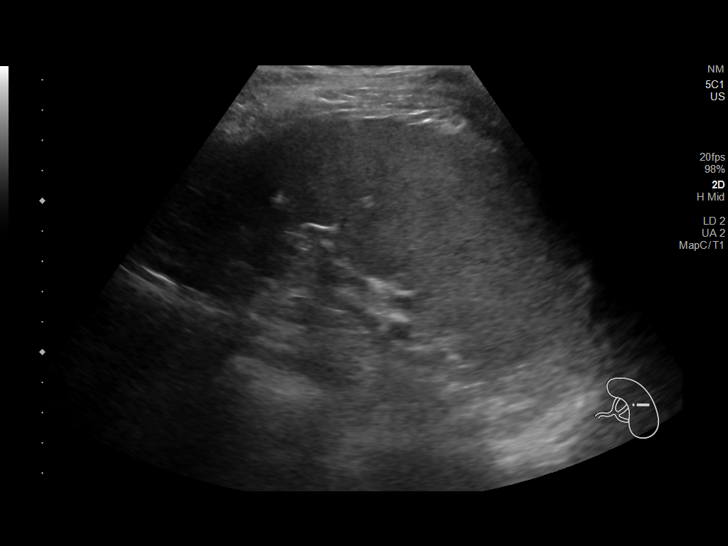
[im 67/101]
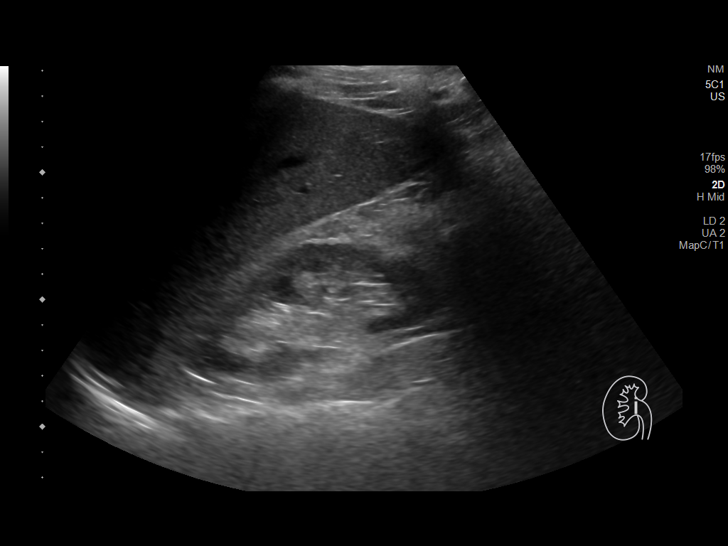
[im 76/101]
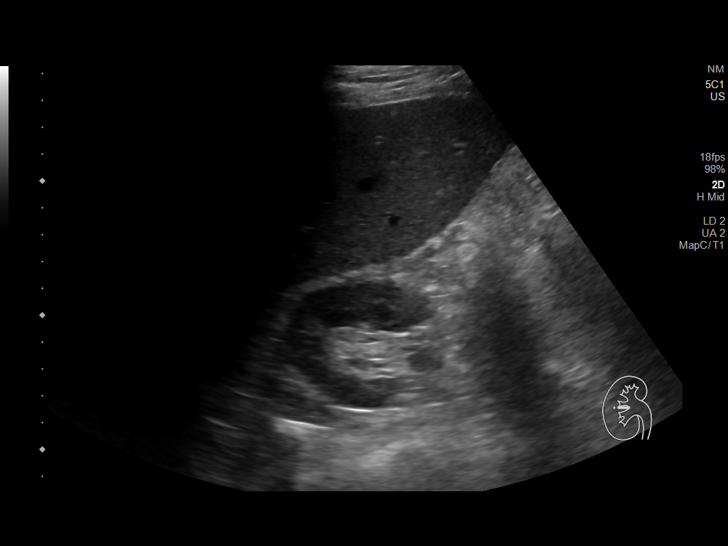
[im 84/101]
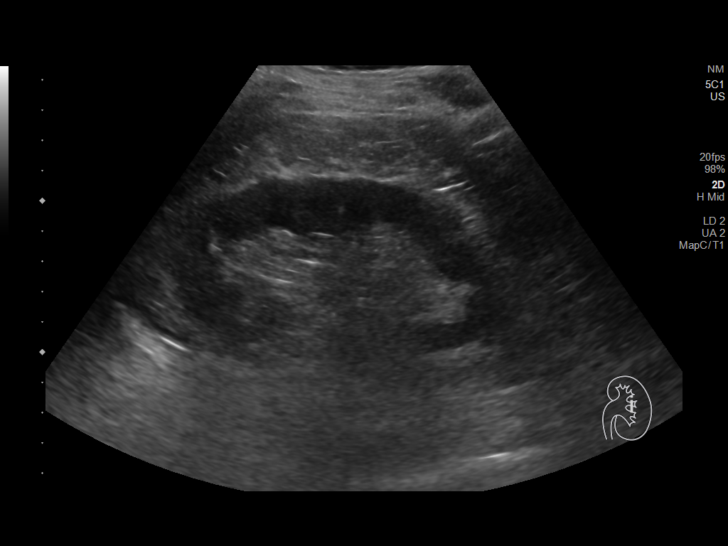
[im 92/101]
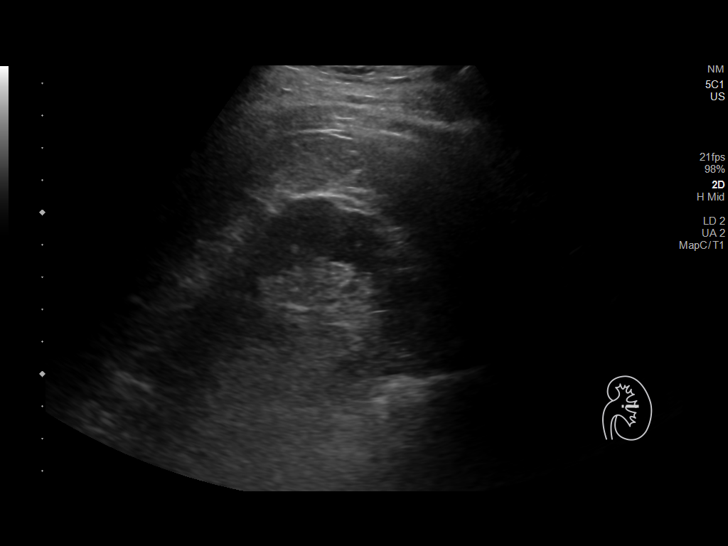
[im 101/101]
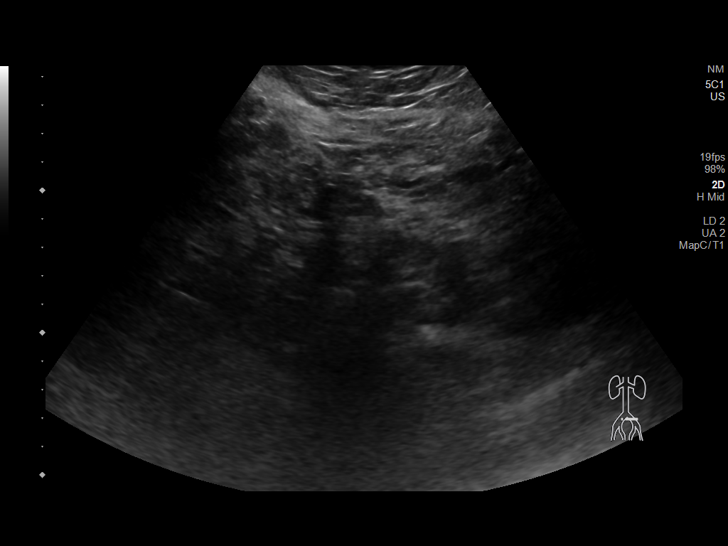

[14 of 25 positions shown; findings below may reference images not displayed]

FINDINGS: Gallbladder: Multiple stones within the gallbladder lumen. No
gallbladder wall thickening or pericholecystic fluid. Negative
sonographic Murphy sign.

Common bile duct: Diameter: 3 mm

Liver: No focal lesion identified. Within normal limits in
parenchymal echogenicity. Portal vein is patent on color Doppler
imaging with normal direction of blood flow towards the liver.

IVC: No abnormality visualized.

Pancreas: Visualized portion unremarkable.

Spleen: Size and appearance within normal limits.

Right Kidney: Length: 11.3 cm. Echogenicity within normal limits. No
mass or hydronephrosis visualized.

Left Kidney: Length: 11.1 cm. Echogenicity within normal limits. No
mass or hydronephrosis visualized.

Abdominal aorta: No aneurysm visualized.

Other findings: None.
IMPRESSION: The gallbladder is filled with multiple small stones and sludge. No
secondary signs to suggest acute cholecystitis.

No acute process within the abdomen.

## 2020-12-25 ENCOUNTER — Ambulatory Visit (INDEPENDENT_AMBULATORY_CARE_PROVIDER_SITE_OTHER): Payer: BC Managed Care – PPO | Admitting: Medical

## 2020-12-25 ENCOUNTER — Other Ambulatory Visit: Payer: Self-pay

## 2020-12-25 ENCOUNTER — Encounter: Payer: Self-pay | Admitting: Medical

## 2020-12-25 VITALS — BP 103/56 | HR 71 | Resp 18 | Ht 69.0 in | Wt 180.2 lb

## 2020-12-25 DIAGNOSIS — D72829 Elevated white blood cell count, unspecified: Secondary | ICD-10-CM | POA: Diagnosis not present

## 2020-12-25 DIAGNOSIS — K851 Biliary acute pancreatitis without necrosis or infection: Secondary | ICD-10-CM | POA: Diagnosis not present

## 2020-12-25 DIAGNOSIS — R7989 Other specified abnormal findings of blood chemistry: Secondary | ICD-10-CM | POA: Diagnosis not present

## 2020-12-25 NOTE — Patient Instructions (Addendum)
For recent hx of gallstones, elevated lft, elevated lipase and pancreatitis by imaging will repeat below labs. Now clinically improved.  Avoid fatty, fried foods and all alcohol.  Recommend bland healthy foods.  Sign release forms today and show staff your DC summary. Need to view labs and imaging etc.  Do want to go ahead and get you back in with your former surgeon. Referral placed.   Follow up in 3-4 weeks or as needed.  If recurrent severe bout as described then ED evaluation.

## 2020-12-25 NOTE — Progress Notes (Signed)
Subjective:    Patient ID: Ian Ramirez, male    DOB: [birth year], 50 y.o.   MRN: [id number]  HPI  Pt in for follow up.  [place] was at [facility]. Pt states he was evaluated in ED Tuesday of past week. He was told he had complications from gallstones. He has known stones and never had operation. Told he had pancreatitis related to stones. In addition to recent mild alcohol use. Just one glass a day.   Pt was admitted 3 days and 2 nights. He was npo for about one day. He was transitioned to bland foods. He was discharge him on Friday.  Pt states feeling good now. He is avoiding fried foods, fatty foods and alcohol.  Oct 25, 2019 [place] report showed.   IMPRESSION: The gallbladder is filled with multiple small stones and sludge. No secondary signs to suggest acute cholecystitis.   No acute process within the abdomen.  DC summary mentioned pancreatitis, gallstones, eleevated lft, wbc elevation and pacreatitis.    Review of Systems  Constitutional:  Negative for chills, fatigue and fever.  HENT:  Negative for congestion.   Respiratory:  Negative for cough, chest tightness, shortness of breath and wheezing.   Cardiovascular:  Negative for chest pain and palpitations.  Gastrointestinal:  Negative for abdominal distention, abdominal pain, blood in stool, diarrhea and nausea.  Genitourinary:  Negative for dysuria, flank pain and frequency.  Musculoskeletal:  Negative for back pain, joint swelling and myalgias.  Neurological:  Negative for dizziness, weakness and headaches.  Hematological:  Negative for adenopathy. Does not bruise/bleed easily.  Psychiatric/Behavioral:  Negative for behavioral problems, confusion and hallucinations. The patient is not nervous/anxious.    No past medical history on file.   Social History   Socioeconomic History   Marital status: Married    Spouse name: Not on file   Number of children: Not on file   Years of education: Not on file    Highest education level: Not on file  Occupational History   Not on file  Tobacco Use   Smoking status: Never   Smokeless tobacco: Never  Vaping Use   Vaping Use: Never used  Substance and Sexual Activity   Alcohol use: Yes    Alcohol/week: 1.0 standard drink    Types: 1 Cans of beer per week    Comment: rare.   Drug use: Never   Sexual activity: Yes  Other Topics Concern   Not on file  Social History Narrative   Not on file   Social Determinants of Health   Financial Resource Strain: Not on file  Food Insecurity: Not on file  Transportation Needs: Not on file  Physical Activity: Not on file  Stress: Not on file  Social Connections: Not on file  Intimate Partner Violence: Not on file    Past Surgical History:  Procedure Laterality Date   WISDOM TOOTH EXTRACTION      Family History  Problem Relation Age of Onset   Colon cancer Neg Hx    Colon polyps Neg Hx    Esophageal cancer Neg Hx    Rectal cancer Neg Hx    Stomach cancer Neg Hx     No Known Allergies  Current Outpatient Medications on File Prior to Visit  Medication Sig Dispense Refill   alum & mag hydroxide-simeth (MAALOX PLUS) 400-400-40 MG/5ML suspension Take 15 mLs by mouth every 6 (six) hours as needed for indigestion. 355 mL 0   dicyclomine (BENTYL) 20 MG tablet  Take 1 tablet (20 mg total) by mouth 4 (four) times daily -  before meals and at bedtime. 20 tablet 0   omeprazole (PRILOSEC) 20 MG capsule Take 1 capsule (20 mg total) by mouth 2 (two) times daily before a meal. 180 capsule 3   No current facility-administered medications on file prior to visit.    BP (!) 103/56   Pulse 71   Resp 18   Ht 5\' 9"  (1.753 m)   Wt 180 lb 3.2 oz (81.7 kg)   SpO2 98%   BMI 26.61 kg/m        Objective:   Physical Exam  General- No acute distress. Pleasant patient. Neck- Full range of motion, no jvd Lungs- Clear, even and unlabored. Heart- regular rate and rhythm. Neurologic- CNII- XII grossly intact.   Abdomen-  Back no cva tenderness. Abdomen- soft, nd, +bs, no rebound or guarding.no organomegaly. Mild tender rt of xyphoid process region.       Assessment & Plan:  For recent hx of gallstones, elevated lft, elevated lipase and pancreatitis by imaging will repeat below labs. Now clinically improved.  Avoid fatty, fried foods and all alcohol.  Recommend bland healthy foods.  Sign release forms today and show staff your DC summary. Need to view labs and imaging etc.  Do want to go ahead and get you back in with your former surgeon. Referral placed.   Follow up in 3-4 weeks or as needed.  If recurrent severe bout as described then ED evaluation.  , PA-C

## 2020-12-26 LAB — CBC WITH DIFFERENTIAL/PLATELET
Basophils Absolute: 0.1 10*3/uL (ref 0.0–0.1)
Basophils Relative: 0.7 % (ref 0.0–3.0)
Eosinophils Absolute: 0.4 10*3/uL (ref 0.0–0.7)
Eosinophils Relative: 3.9 % (ref 0.0–5.0)
HCT: 38 % — ABNORMAL LOW (ref 39.0–52.0)
Hemoglobin: 13 g/dL (ref 13.0–17.0)
Lymphocytes Relative: 14.5 % (ref 12.0–46.0)
Lymphs Abs: 1.5 10*3/uL (ref 0.7–4.0)
MCHC: 34.2 g/dL (ref 30.0–36.0)
MCV: 83.8 fl (ref 78.0–100.0)
Monocytes Absolute: 0.9 10*3/uL (ref 0.1–1.0)
Monocytes Relative: 8.8 % (ref 3.0–12.0)
Neutro Abs: 7.6 10*3/uL (ref 1.4–7.7)
Neutrophils Relative %: 72.1 % (ref 43.0–77.0)
Platelets: 349 10*3/uL (ref 150.0–400.0)
RBC: 4.53 Mil/uL (ref 4.22–5.81)
RDW: 12.1 % (ref 11.5–15.5)
WBC: 10.5 10*3/uL (ref 4.0–10.5)

## 2020-12-26 LAB — COMPREHENSIVE METABOLIC PANEL
ALT: 36 U/L (ref 0–53)
AST: 21 U/L (ref 0–37)
Albumin: 4 g/dL (ref 3.5–5.2)
Alkaline Phosphatase: 64 U/L (ref 39–117)
BUN: 17 mg/dL (ref 6–23)
CO2: 27 mEq/L (ref 19–32)
Calcium: 9 mg/dL (ref 8.4–10.5)
Chloride: 104 mEq/L (ref 96–112)
Creatinine, Ser: 0.78 mg/dL (ref 0.40–1.50)
GFR: 104.8 mL/min (ref >60.00)
Glucose, Bld: 92 mg/dL (ref 70–99)
Potassium: 4.2 mEq/L (ref 3.5–5.1)
Sodium: 139 mEq/L (ref 135–145)
Total Bilirubin: 0.5 mg/dL (ref 0.2–1.2)
Total Protein: 6.9 g/dL (ref 6.0–8.3)

## 2020-12-26 LAB — LIPASE: Lipase: 127 U/L — ABNORMAL HIGH (ref 11.0–59.0)

## 2020-12-26 LAB — AMYLASE: Amylase: 43 U/L (ref 27–131)

## 2020-12-26 NOTE — Addendum Note (Signed)
Addended by: Gwenevere Abbot on: 12/26/2020 03:01 PM   Modules accepted: Orders

## 2020-12-30 ENCOUNTER — Other Ambulatory Visit (INDEPENDENT_AMBULATORY_CARE_PROVIDER_SITE_OTHER): Payer: BC Managed Care – PPO

## 2020-12-30 ENCOUNTER — Other Ambulatory Visit: Payer: Self-pay

## 2020-12-30 DIAGNOSIS — K851 Biliary acute pancreatitis without necrosis or infection: Secondary | ICD-10-CM

## 2020-12-31 LAB — LIPASE: Lipase: 57 U/L (ref 11.0–59.0)

## 2021-03-04 ENCOUNTER — Ambulatory Visit: Payer: Self-pay | Admitting: Surgery

## 2021-03-04 DIAGNOSIS — K8010 Calculus of gallbladder with chronic cholecystitis without obstruction: Secondary | ICD-10-CM | POA: Diagnosis not present

## 2021-03-04 DIAGNOSIS — K851 Biliary acute pancreatitis without necrosis or infection: Secondary | ICD-10-CM | POA: Diagnosis not present

## 2021-06-11 NOTE — Pre-Procedure Instructions (Signed)
Surgical Instructions    Your procedure is scheduled on Thursday 06/19/21.   Report to [facility] Main Entrance "A" at 05:30 A.M., then check in with the Admitting office.  Call this number if you have problems the morning of surgery:  [phone]   If you have any questions prior to your surgery date call [phone]: Open Monday-Friday 8am-4pm    Remember:  Do not eat after midnight the night before your surgery  You may drink clear liquids until 04:15 A.M. the morning of your surgery.   Clear liquids allowed are: Water, Non-Citrus Juices (without pulp), Carbonated Beverages, Clear Tea, Black Coffee ONLY (NO MILK, CREAM OR POWDERED CREAMER of any kind), and Gatorade    Take these medicines the morning of surgery with A SIP OF WATER   NONE  As of today, STOP taking any Aspirin (unless otherwise instructed by your surgeon) Aleve, Naproxen, Ibuprofen, Motrin, Advil, Goody's, BC's, all herbal medications, fish oil, and all vitamins.     After your COVID test   You are not required to quarantine however you are required to wear a well-fitting mask when you are out and around people not in your household.  If your mask becomes wet or soiled, replace with a new one.  Wash your hands often with soap and water for 20 seconds or clean your hands with an alcohol-based hand sanitizer that contains at least 60% alcohol.  Do not share personal items.  Notify your provider: if you are in close contact with someone who has COVID  or if you develop a fever of 100.4 or greater, sneezing, cough, sore throat, shortness of breath or body aches.             Do not wear jewelry or makeup Do not wear lotions, powders, perfumes/colognes, or deodorant. Do not shave 48 hours prior to surgery.  Men may shave face and neck. Do not bring valuables to the hospital. DO Not wear nail polish, gel polish, artificial nails, or any other type of covering on natural nails including finger and toenails. If  patients have artificial nails, gel coating, etc. that need to be removed by a nail salon, please have this removed prior to surgery or surgery may need to be canceled/delayed if the surgeon/ anesthesia feels like the patient is unable to be adequately monitored.             [place] is not responsible for any belongings or valuables.  Do NOT Smoke (Tobacco/Vaping)  24 hours prior to your procedure  If you use a CPAP at night, you may bring your mask for your overnight stay.   Contacts, glasses, hearing aids, dentures or partials may not be worn into surgery, please bring cases for these belongings   For patients admitted to the hospital, discharge time will be determined by your treatment team.   Patients discharged the day of surgery will not be allowed to drive home, and someone needs to stay with them for 24 hours.  NO VISITORS WILL BE ALLOWED IN PRE-OP WHERE PATIENTS ARE PREPPED FOR SURGERY.  ONLY 1 SUPPORT PERSON MAY BE PRESENT IN THE WAITING ROOM WHILE YOU ARE IN SURGERY.  IF YOU ARE TO BE ADMITTED, ONCE YOU ARE IN YOUR ROOM YOU WILL BE ALLOWED TWO (2) VISITORS. 1 (ONE) VISITOR MAY STAY OVERNIGHT BUT MUST ARRIVE TO THE ROOM BY 8pm.  Minor children may have two parents present. Special consideration for safety and communication needs will be reviewed on a  case by case basis.  Special instructions:    Oral Hygiene is also important to reduce your risk of infection.  Remember - BRUSH YOUR TEETH THE MORNING OF SURGERY WITH YOUR REGULAR TOOTHPASTE   [place]- Preparing For Surgery  Before surgery, you can play an important role. Because skin is not sterile, your skin needs to be as free of germs as possible. You can reduce the number of germs on your skin by washing with CHG (chlorahexidine gluconate) Soap before surgery.  CHG is an antiseptic cleaner which kills germs and bonds with the skin to continue killing germs even after washing.     Please do not use if you have an allergy  to CHG or antibacterial soaps. If your skin becomes reddened/irritated stop using the CHG.  Do not shave (including legs and underarms) for at least 48 hours prior to first CHG shower. It is OK to shave your face.  Please follow these instructions carefully.     Shower the NIGHT BEFORE SURGERY and the MORNING OF SURGERY with CHG Soap.   If you chose to wash your hair, wash your hair first as usual with your normal shampoo. After you shampoo, rinse your hair and body thoroughly to remove the shampoo.  Then [employer] and genitals (private parts) with your normal soap and rinse thoroughly to remove soap.  After that Use CHG Soap as you would any other liquid soap. You can apply CHG directly to the skin and wash gently with a scrungie or a clean washcloth.   Apply the CHG Soap to your body ONLY FROM THE NECK DOWN.  Do not use on open wounds or open sores. Avoid contact with your eyes, ears, mouth and genitals (private parts). Wash Face and genitals (private parts)  with your normal soap.   Wash thoroughly, paying special attention to the area where your surgery will be performed.  Thoroughly rinse your body with warm water from the neck down.  DO NOT shower/wash with your normal soap after using and rinsing off the CHG Soap.  Pat yourself dry with a CLEAN TOWEL.  Wear CLEAN PAJAMAS to bed the night before surgery  Place CLEAN SHEETS on your bed the night before your surgery  DO NOT SLEEP WITH PETS.   Day of Surgery:  Take a shower with CHG soap. Wear Clean/Comfortable clothing the morning of surgery Do not apply any deodorants/lotions.   Remember to brush your teeth WITH YOUR REGULAR TOOTHPASTE.   Please read over the following fact sheets that you were given.

## 2021-06-12 ENCOUNTER — Encounter (HOSPITAL_COMMUNITY)
Admission: RE | Admit: 2021-06-12 | Discharge: 2021-06-12 | Disposition: A | Discharge status: Home / Self Care | Payer: BC Managed Care – PPO | Source: Ambulatory Visit | Attending: Surgery | Admitting: Surgery

## 2021-06-12 ENCOUNTER — Encounter (HOSPITAL_COMMUNITY): Payer: Self-pay

## 2021-06-12 ENCOUNTER — Other Ambulatory Visit: Payer: Self-pay

## 2021-06-12 DIAGNOSIS — Z01812 Encounter for preprocedural laboratory examination: Secondary | ICD-10-CM | POA: Insufficient documentation

## 2021-06-12 LAB — COMPREHENSIVE METABOLIC PANEL
ALT: 23 U/L (ref 0–44)
AST: 20 U/L (ref 15–41)
Albumin: 3.9 g/dL (ref 3.5–5.0)
Alkaline Phosphatase: 58 U/L (ref 38–126)
Anion gap: 7 (ref 5–15)
BUN: 9 mg/dL (ref 6–20)
CO2: 27 mmol/L (ref 22–32)
Calcium: 9.3 mg/dL (ref 8.9–10.3)
Chloride: 107 mmol/L (ref 98–111)
Creatinine, Ser: 0.81 mg/dL (ref 0.61–1.24)
GFR, Estimated: >60 mL/min (ref >60)
Glucose, Bld: 86 mg/dL (ref 70–99)
Potassium: 3.8 mmol/L (ref 3.5–5.1)
Sodium: 141 mmol/L (ref 135–145)
Total Bilirubin: 0.8 mg/dL (ref 0.3–1.2)
Total Protein: 6.7 g/dL (ref 6.5–8.1)

## 2021-06-12 LAB — CBC WITH DIFFERENTIAL/PLATELET
Abs Immature Granulocytes: 0.05 10*3/uL (ref 0.00–0.07)
Basophils Absolute: 0 10*3/uL (ref 0.0–0.1)
Basophils Relative: 1 %
Eosinophils Absolute: 0.3 10*3/uL (ref 0.0–0.5)
Eosinophils Relative: 6 %
HCT: 40.6 % (ref 39.0–52.0)
Hemoglobin: 13.8 g/dL (ref 13.0–17.0)
Immature Granulocytes: 1 %
Lymphocytes Relative: 30 %
Lymphs Abs: 1.5 10*3/uL (ref 0.7–4.0)
MCH: 28.4 pg (ref 26.0–34.0)
MCHC: 34 g/dL (ref 30.0–36.0)
MCV: 83.5 fL (ref 80.0–100.0)
Monocytes Absolute: 0.4 10*3/uL (ref 0.1–1.0)
Monocytes Relative: 9 %
Neutro Abs: 2.7 10*3/uL (ref 1.7–7.7)
Neutrophils Relative %: 53 %
Platelets: 214 10*3/uL (ref 150–400)
RBC: 4.86 MIL/uL (ref 4.22–5.81)
RDW: 11.6 % (ref 11.5–15.5)
WBC: 5 10*3/uL (ref 4.0–10.5)
nRBC: 0 % (ref 0.0–0.2)

## 2021-06-12 NOTE — Progress Notes (Signed)
PCP - Mackie Pai, PA-C Cardiologist - denies  PPM/ICD - denies   Chest x-ray - denies EKG - denies Stress Test - denies ECHO - denies Cardiac Cath - denies  Sleep Study - denies   DM- denies  Blood Thinner Instructions: n/a Aspirin Instructions: n/a  ERAS Protcol - yes, no drink   COVID TEST- n/a, ambulatory surgery   Anesthesia review: no  Patient denies shortness of breath, fever, cough and chest pain at PAT appointment   All instructions explained to the patient, with a verbal understanding of the material. Patient agrees to go over the instructions while at home for a better understanding. Patient also instructed to wear a mask in public for 3 days prior to surgery. The opportunity to ask questions was provided.

## 2021-06-18 NOTE — Anesthesia Preprocedure Evaluation (Addendum)
Anesthesia Evaluation  Patient identified by MRN, date of birth, ID band Patient awake    Reviewed: Allergy & Precautions, NPO status , Patient's Chart, lab work & pertinent test results  History of Anesthesia Complications Negative for: history of anesthetic complications  Airway Mallampati: I  TM Distance: >3 FB Neck ROM: Full    Dental  (+) Dental Advisory Given, Chipped,    Pulmonary neg pulmonary ROS,    Pulmonary exam normal        Cardiovascular negative cardio ROS Normal cardiovascular exam     Neuro/Psych negative neurological ROS  negative psych ROS   GI/Hepatic negative GI ROS, Neg liver ROS,   Endo/Other  negative endocrine ROS  Renal/GU negative Renal ROS     Musculoskeletal negative musculoskeletal ROS (+)   Abdominal   Peds  Hematology negative hematology ROS (+)   Anesthesia Other Findings   Reproductive/Obstetrics                            Anesthesia Physical Anesthesia Plan  ASA: 1  Anesthesia Plan: General   Post-op Pain Management: Tylenol PO (pre-op) and Celebrex PO (pre-op)   Induction: Intravenous  PONV Risk Score and Plan: 2 and Treatment may vary due to age or medical condition, Ondansetron, Dexamethasone, Midazolam and Scopolamine patch - Pre-op  Airway Management Planned: Oral ETT  Additional Equipment: None  Intra-op Plan:   Post-operative Plan: Extubation in OR  Informed Consent: I have reviewed the patients History and Physical, chart, labs and discussed the procedure including the risks, benefits and alternatives for the proposed anesthesia with the patient or authorized representative who has indicated his/her understanding and acceptance.     Dental advisory given  Plan Discussed with: CRNA and Anesthesiologist  Anesthesia Plan Comments:        Anesthesia Quick Evaluation

## 2021-06-19 ENCOUNTER — Other Ambulatory Visit: Payer: Self-pay

## 2021-06-19 ENCOUNTER — Ambulatory Visit (HOSPITAL_COMMUNITY): Payer: BC Managed Care – PPO | Admitting: Certified Registered Nurse Anesthetist

## 2021-06-19 ENCOUNTER — Encounter (HOSPITAL_COMMUNITY): Payer: Self-pay | Admitting: Surgery

## 2021-06-19 ENCOUNTER — Encounter (HOSPITAL_COMMUNITY)
Admission: RE | Disposition: A | Discharge status: Home / Self Care | Payer: Self-pay | Source: Home / Self Care | Attending: Surgery

## 2021-06-19 ENCOUNTER — Ambulatory Visit (HOSPITAL_COMMUNITY)
Admission: RE | Admit: 2021-06-19 | Discharge: 2021-06-19 | Disposition: A | Discharge status: Home / Self Care | Payer: BC Managed Care – PPO | Attending: Surgery | Admitting: Surgery

## 2021-06-19 ENCOUNTER — Ambulatory Visit (HOSPITAL_COMMUNITY): Payer: BC Managed Care – PPO

## 2021-06-19 DIAGNOSIS — K8010 Calculus of gallbladder with chronic cholecystitis without obstruction: Secondary | ICD-10-CM | POA: Diagnosis not present

## 2021-06-19 DIAGNOSIS — K851 Biliary acute pancreatitis without necrosis or infection: Secondary | ICD-10-CM | POA: Insufficient documentation

## 2021-06-19 DIAGNOSIS — K811 Chronic cholecystitis: Secondary | ICD-10-CM | POA: Diagnosis not present

## 2021-06-19 DIAGNOSIS — Z419 Encounter for procedure for purposes other than remedying health state, unspecified: Secondary | ICD-10-CM

## 2021-06-19 DIAGNOSIS — K8020 Calculus of gallbladder without cholecystitis without obstruction: Secondary | ICD-10-CM | POA: Diagnosis not present

## 2021-06-19 HISTORY — PX: CHOLECYSTECTOMY: SHX55

## 2021-06-19 HISTORY — DX: Other specified health status: Z78.9

## 2021-06-19 SURGERY — LAPAROSCOPIC CHOLECYSTECTOMY WITH INTRAOPERATIVE CHOLANGIOGRAM
Anesthesia: General

## 2021-06-19 MED ORDER — CHLORHEXIDINE GLUCONATE 0.12 % MT SOLN
OROMUCOSAL | Status: AC
  Administered 2021-06-19: 15 mL via OROMUCOSAL
  Filled 2021-06-19: qty 15

## 2021-06-19 MED ORDER — MIDAZOLAM HCL 2 MG/2ML IJ SOLN
INTRAMUSCULAR | Status: DC | PRN
  Administered 2021-06-19: 2 mg via INTRAVENOUS

## 2021-06-19 MED ORDER — SUGAMMADEX SODIUM 200 MG/2ML IV SOLN
INTRAVENOUS | Status: DC | PRN
Start: 2021-06-19 — End: 2021-06-19
  Administered 2021-06-19: 200 mg via INTRAVENOUS

## 2021-06-19 MED ORDER — EPHEDRINE SULFATE-NACL 50-0.9 MG/10ML-% IV SOSY
PREFILLED_SYRINGE | INTRAVENOUS | Status: DC | PRN
  Administered 2021-06-19: 10 mg via INTRAVENOUS
  Administered 2021-06-19 (×3): 5 mg via INTRAVENOUS

## 2021-06-19 MED ORDER — CHLORHEXIDINE GLUCONATE CLOTH 2 % EX PADS: 6.0000 | MEDICATED_PAD | Freq: Once | CUTANEOUS | Status: DC

## 2021-06-19 MED ORDER — IOHEXOL 300 MG/ML  SOLN
INTRAMUSCULAR | Status: DC | PRN
Start: 2021-06-19 — End: 2021-06-19
  Administered 2021-06-19: 8 mL

## 2021-06-19 MED ORDER — LACTATED RINGERS IV SOLN: INTRAVENOUS | Status: DC

## 2021-06-19 MED ORDER — SODIUM CHLORIDE 0.9 % IR SOLN
Status: DC | PRN
  Administered 2021-06-19: 1000 mL

## 2021-06-19 MED ORDER — CELECOXIB 200 MG PO CAPS
200.0000 mg | ORAL_CAPSULE | Freq: Once | ORAL | Status: AC
Start: 2021-06-19 — End: 2021-06-19
  Administered 2021-06-19: 200 mg via ORAL
  Filled 2021-06-19: qty 1

## 2021-06-19 MED ORDER — ONDANSETRON HCL 4 MG/2ML IJ SOLN
INTRAMUSCULAR | Status: DC | PRN
  Administered 2021-06-19: 4 mg via INTRAVENOUS

## 2021-06-19 MED ORDER — SODIUM CHLORIDE 0.9 % IV SOLN: INTRAVENOUS | Status: DC | PRN

## 2021-06-19 MED ORDER — OXYCODONE HCL 5 MG PO TABS: 5.0000 mg | ORAL_TABLET | Freq: Four times a day (QID) | ORAL | 0 refills | Status: DC | PRN

## 2021-06-19 MED ORDER — LIDOCAINE 2% (20 MG/ML) 5 ML SYRINGE
INTRAMUSCULAR | Status: DC | PRN
Start: 2021-06-19 — End: 2021-06-19
  Administered 2021-06-19: 80 mg via INTRAVENOUS

## 2021-06-19 MED ORDER — CHLORHEXIDINE GLUCONATE 0.12 % MT SOLN: 15.0000 mL | Freq: Once | OROMUCOSAL | Status: AC

## 2021-06-19 MED ORDER — HEMOSTATIC AGENTS (NO CHARGE) OPTIME
TOPICAL | Status: DC | PRN
  Administered 2021-06-19: 1 via TOPICAL

## 2021-06-19 MED ORDER — 0.9 % SODIUM CHLORIDE (POUR BTL) OPTIME
TOPICAL | Status: DC | PRN
Start: 2021-06-19 — End: 2021-06-19
  Administered 2021-06-19: 1000 mL

## 2021-06-19 MED ORDER — MIDAZOLAM HCL 2 MG/2ML IJ SOLN
INTRAMUSCULAR | Status: AC
  Filled 2021-06-19: qty 2

## 2021-06-19 MED ORDER — PROPOFOL 10 MG/ML IV BOLUS
INTRAVENOUS | Status: AC
  Filled 2021-06-19: qty 20

## 2021-06-19 MED ORDER — FENTANYL CITRATE (PF) 250 MCG/5ML IJ SOLN
INTRAMUSCULAR | Status: AC
  Filled 2021-06-19: qty 5

## 2021-06-19 MED ORDER — ACETAMINOPHEN 500 MG PO TABS
1000.0000 mg | ORAL_TABLET | Freq: Once | ORAL | Status: AC
  Administered 2021-06-19: 1000 mg via ORAL
  Filled 2021-06-19: qty 2

## 2021-06-19 MED ORDER — BUPIVACAINE-EPINEPHRINE 0.25% -1:200000 IJ SOLN
INTRAMUSCULAR | Status: DC | PRN
  Administered 2021-06-19: 8 mL

## 2021-06-19 MED ORDER — IBUPROFEN 800 MG PO TABS: 800.0000 mg | ORAL_TABLET | Freq: Three times a day (TID) | ORAL | 0 refills | Status: DC | PRN

## 2021-06-19 MED ORDER — FENTANYL CITRATE (PF) 250 MCG/5ML IJ SOLN
INTRAMUSCULAR | Status: DC | PRN
  Administered 2021-06-19 (×2): 50 ug via INTRAVENOUS
  Administered 2021-06-19: 150 ug via INTRAVENOUS

## 2021-06-19 MED ORDER — BUPIVACAINE-EPINEPHRINE (PF) 0.25% -1:200000 IJ SOLN
INTRAMUSCULAR | Status: AC
  Filled 2021-06-19: qty 30

## 2021-06-19 MED ORDER — CEFAZOLIN SODIUM-DEXTROSE 2-4 GM/100ML-% IV SOLN
INTRAVENOUS | Status: AC
  Filled 2021-06-19: qty 100

## 2021-06-19 MED ORDER — ORAL CARE MOUTH RINSE: 15.0000 mL | Freq: Once | OROMUCOSAL | Status: AC

## 2021-06-19 MED ORDER — DEXAMETHASONE SODIUM PHOSPHATE 10 MG/ML IJ SOLN
INTRAMUSCULAR | Status: DC | PRN
Start: 2021-06-19 — End: 2021-06-19
  Administered 2021-06-19: 5 mg via INTRAVENOUS

## 2021-06-19 MED ORDER — CEFAZOLIN SODIUM-DEXTROSE 2-4 GM/100ML-% IV SOLN
2.0000 g | INTRAVENOUS | Status: AC
  Administered 2021-06-19: 2 g via INTRAVENOUS

## 2021-06-19 MED ORDER — PROPOFOL 10 MG/ML IV BOLUS
INTRAVENOUS | Status: DC | PRN
  Administered 2021-06-19: 200 mg via INTRAVENOUS

## 2021-06-19 MED ORDER — DIPHENHYDRAMINE HCL 50 MG/ML IJ SOLN
INTRAMUSCULAR | Status: DC | PRN
  Administered 2021-06-19: 12.5 mg via INTRAVENOUS

## 2021-06-19 MED ORDER — ROCURONIUM BROMIDE 10 MG/ML (PF) SYRINGE
PREFILLED_SYRINGE | INTRAVENOUS | Status: DC | PRN
  Administered 2021-06-19: 50 mg via INTRAVENOUS

## 2021-06-19 SURGICAL SUPPLY — 46 items
APPLICATOR ARISTA FLEXITIP XL (MISCELLANEOUS) ×1 IMPLANT
APPLIER CLIP ROT 10 11.4 M/L (STAPLE) ×2
BAG COUNTER SPONGE SURGICOUNT (BAG) ×2 IMPLANT
BLADE CLIPPER SURG (BLADE) ×1 IMPLANT
CANISTER SUCT 3000ML PPV (MISCELLANEOUS) ×2 IMPLANT
CHLORAPREP W/TINT 26 (MISCELLANEOUS) ×2 IMPLANT
CLIP APPLIE ROT 10 11.4 M/L (STAPLE) ×1 IMPLANT
CNTNR SPEC C3OZ STD GRAD LEK (MISCELLANEOUS) IMPLANT
CONT SPEC 3OZ W/LID STRL (MISCELLANEOUS) ×1
COVER MAYO STAND STRL (DRAPES) ×2 IMPLANT
COVER SURGICAL LIGHT HANDLE (MISCELLANEOUS) ×2 IMPLANT
DERMABOND ADVANCED (GAUZE/BANDAGES/DRESSINGS) ×1
DERMABOND ADVANCED .7 DNX12 (GAUZE/BANDAGES/DRESSINGS) ×1 IMPLANT
DRAPE C-ARM 42X120 X-RAY (DRAPES) ×2 IMPLANT
ELECT REM PT RETURN 9FT ADLT (ELECTROSURGICAL) ×2
ELECTRODE REM PT RTRN 9FT ADLT (ELECTROSURGICAL) ×1 IMPLANT
GLOVE SRG 8 PF TXTR STRL LF DI (GLOVE) ×1 IMPLANT
GLOVE SURG ENC MOIS LTX SZ8 (GLOVE) ×2 IMPLANT
GLOVE SURG UNDER POLY LF SZ8 (GLOVE) ×1
GOWN STRL REUS W/ TWL LRG LVL3 (GOWN DISPOSABLE) ×2 IMPLANT
GOWN STRL REUS W/ TWL XL LVL3 (GOWN DISPOSABLE) ×1 IMPLANT
GOWN STRL REUS W/TWL LRG LVL3 (GOWN DISPOSABLE) ×2
GOWN STRL REUS W/TWL XL LVL3 (GOWN DISPOSABLE) ×1
HEMOSTAT ARISTA ABSORB 3G PWDR (HEMOSTASIS) ×1 IMPLANT
HEMOSTAT SNOW SURGICEL 2X4 (HEMOSTASIS) ×1 IMPLANT
KIT BASIN OR (CUSTOM PROCEDURE TRAY) ×2 IMPLANT
KIT TURNOVER KIT B (KITS) ×2 IMPLANT
NS IRRIG 1000ML POUR BTL (IV SOLUTION) ×2 IMPLANT
PAD ARMBOARD 7.5X6 YLW CONV (MISCELLANEOUS) ×2 IMPLANT
POUCH RETRIEVAL ECOSAC 10 (ENDOMECHANICALS) ×1 IMPLANT
POUCH RETRIEVAL ECOSAC 10MM (ENDOMECHANICALS) ×1
SCISSORS LAP 5X35 DISP (ENDOMECHANICALS) ×2 IMPLANT
SET CHOLANGIOGRAPH 5 50 .035 (SET/KITS/TRAYS/PACK) ×2 IMPLANT
SET IRRIG TUBING LAPAROSCOPIC (IRRIGATION / IRRIGATOR) ×2 IMPLANT
SET TUBE SMOKE EVAC HIGH FLOW (TUBING) ×2 IMPLANT
SLEEVE ENDOPATH XCEL 5M (ENDOMECHANICALS) ×2 IMPLANT
SPECIMEN JAR SMALL (MISCELLANEOUS) ×2 IMPLANT
SUT MNCRL AB 4-0 PS2 18 (SUTURE) ×3 IMPLANT
TOWEL GREEN STERILE (TOWEL DISPOSABLE) ×2 IMPLANT
TOWEL GREEN STERILE FF (TOWEL DISPOSABLE) ×2 IMPLANT
TRAY LAPAROSCOPIC MC (CUSTOM PROCEDURE TRAY) ×2 IMPLANT
TROCAR XCEL BLUNT TIP 100MML (ENDOMECHANICALS) ×2 IMPLANT
TROCAR XCEL NON-BLD 11X100MML (ENDOMECHANICALS) ×2 IMPLANT
TROCAR XCEL NON-BLD 5MMX100MML (ENDOMECHANICALS) ×2 IMPLANT
WARMER LAPAROSCOPE (MISCELLANEOUS) ×2 IMPLANT
WATER STERILE IRR 1000ML POUR (IV SOLUTION) ×2 IMPLANT

## 2021-06-19 NOTE — Anesthesia Procedure Notes (Signed)
Procedure Name: Intubation Date/Time: 06/19/2021 7:23 AM Performed by: Janace Litten, CRNA Pre-anesthesia Checklist: Patient identified, Emergency Drugs available, Suction available and Patient being monitored Patient Re-evaluated:Patient Re-evaluated prior to induction Oxygen Delivery Method: Circle System Utilized Preoxygenation: Pre-oxygenation with 100% oxygen Induction Type: IV induction Ventilation: Mask ventilation without difficulty Laryngoscope Size: Mac and 4 Grade View: Grade I Tube type: Oral Tube size: 7.5 mm Number of attempts: 1 Airway Equipment and Method: Stylet and Oral airway Placement Confirmation: ETT inserted through vocal cords under direct vision, positive ETCO2 and breath sounds checked- equal and bilateral Secured at: 21 cm Tube secured with: Tape Dental Injury: Teeth and Oropharynx as per pre-operative assessment

## 2021-06-19 NOTE — Interval H&P Note (Signed)
History and Physical Interval Note:  06/19/2021 7:06 AM  Ian Ramirez  has presented today for surgery, with the diagnosis of gallstones.  The various methods of treatment have been discussed with the patient and family. After consideration of risks, benefits and other options for treatment, the patient has consented to  Procedure(s): LAPAROSCOPIC CHOLECYSTECTOMY WITH INTRAOPERATIVE CHOLANGIOGRAM (N/A) as a surgical intervention.  The patient's history has been reviewed, patient examined, no change in status, stable for surgery.  I have reviewed the patient's chart and labs.  Questions were answered to the patient's satisfaction.     Niema Carrara A Kristyl [place]

## 2021-06-19 NOTE — Op Note (Signed)
Laparoscopic Cholecystectomy with IOC Procedure Note  Indications: This patient presents with symptomatic gallbladder disease and will undergo laparoscopic cholecystectomy.Pt has a remote history of gallstone pancreatitis but was never seen initially for surgery. This was on 2021.  He had a recurrent bout of colic this past July 2022.  He was seen and evaluated and felt that cholecystectomy his best choice.  The procedure has been discussed with the patient. Operative and non operative treatments have been discussed. Risks of surgery include bleeding, infection,  Common bile duct injury,  Injury to the stomach,liver, colon,small intestine, abdominal wall,  Diaphragm,  Major blood vessels,  And the need for an open procedure.  Other risks include worsening of medical problems, death,  DVT and pulmonary embolism, and cardiovascular events.   Medical options have also been discussed. The patient has been informed of long term expectations of surgery and non surgical options,  The patient agrees to proceed.     Pre-operative Diagnosis: Chronic cholecystitis/ gallstone pancreatitis   Post-operative Diagnosis: Same  Surgeon: Dortha Schwalbe  MD   Assistants: OR staff  Anesthesia: General endotracheal anesthesia and Local anesthesia 0.25.% bupivacaine, with epinephrine  ASA Class: 2  Procedure Details  The patient was seen again in the Holding Room. The risks, benefits, complications, treatment options, and expected outcomes were discussed with the patient. The possibilities of reaction to medication, pulmonary aspiration, perforation of viscus, bleeding, recurrent infection, finding a normal gallbladder, the need for additional procedures, failure to diagnose a condition, the possible need to convert to an open procedure, and creating a complication requiring transfusion or operation were discussed with the patient. The patient and/or family concurred with the proposed plan, giving informed consent. The  site of surgery properly noted/marked. The patient was taken to Operating Room, identified as Ian Ramirez and the procedure verified as Laparoscopic Cholecystectomy with Intraoperative Cholangiograms. A Time Out was held and the above information confirmed.  Prior to the induction of general anesthesia, antibiotic prophylaxis was administered. General endotracheal anesthesia was then administered and tolerated well. After the induction, the abdomen was prepped in the usual sterile fashion. The patient was positioned in the supine position with the left arm comfortably tucked, along with some reverse Trendelenburg.  Local anesthetic agent was injected into the skin near the umbilicus and an incision made. The midline fascia was incised and the Hasson technique was used to introduce a 12 mm port under direct vision. It was secured with a figure of eight Vicryl suture placed in the usual fashion. Pneumoperitoneum was then created with CO2 and tolerated well without any adverse changes in the patient's vital signs. Additional trocars were introduced under direct vision with an 11 mm trocar in the epigastrium and 2 5 mm trocars in the right upper quadrant. All skin incisions were infiltrated with a local anesthetic agent before making the incision and placing the trocars.   The gallbladder was identified, the fundus grasped and retracted cephalad. Adhesions were lysed bluntly and with the electrocautery where indicated, taking care not to injure any adjacent organs or viscus.  He had severe chronic cholecystitis and a contracted gallbladder with dense fibrosis. The infundibulum was grasped and retracted laterally, exposing the peritoneum overlying the triangle of Calot. This was then divided and exposed in a blunt fashion. The cystic duct was clearly identified and bluntly dissected circumferentially. The junctions of the gallbladder, cystic duct and common bile duct were clearly identified prior to the division  of any linear structure.   An  incision was made in the cystic duct and the cholangiogram catheter introduced. The catheter was secured using an endoclip. The study showed no stones and good visualization of the distal and proximal biliary tree. The catheter was then removed.     The cystic duct was then  ligated with surgical clips  on the patient side and  clipped on the gallbladder side and divided. The cystic artery was identified, dissected free, ligated with clips and divided as well. Posterior cystic artery clipped and divided.      The gallbladder was dissected from the liver bed in retrograde fashion with the electrocautery. The gallbladder was removed. The liver bed was irrigated and inspected. Hemostasis was achieved with the electrocautery,  Arista and Surgicel snow due to oozing. . Copious irrigation was utilized  prior to this and was repeatedly aspirated until clear all particulate matter. Hemostasis was achieved with no signs of bleeding or bile leakage.  Pneumoperitoneum was completely reduced after viewing removal of the trocars under direct vision. The wound was thoroughly irrigated and the fascia was then closed with a figure of eight suture; the skin was then closed with 4 O monocryl  and a sterile dressing of Dermabond was applied.  Instrument, sponge, and needle counts were correct at closure and at the conclusion of the case.   Findings: Cholecystitis chronic    Estimated Blood Loss: less than 50 mL         Drains: none         Total IV Fluids per record          Specimens: Gallbladder           Complications: None; patient tolerated the procedure well.         Disposition: PACU - hemodynamically stable.         Condition: stable

## 2021-06-19 NOTE — Transfer of Care (Signed)
Immediate Anesthesia Transfer of Care Note  Patient: Ian Ramirez  Procedure(s) Performed: LAPAROSCOPIC CHOLECYSTECTOMY WITH INTRAOPERATIVE CHOLANGIOGRAM  Patient Location: PACU  Anesthesia Type:General  Level of Consciousness: drowsy, patient cooperative and responds to stimulation  Airway & Oxygen Therapy: Patient Spontanous Breathing  Post-op Assessment: Report given to RN and Post -op Vital signs reviewed and stable  Post vital signs: Reviewed and stable  Last Vitals:  Vitals Value Taken Time  BP    Temp    Pulse 70 06/19/21 0902  Resp 7 06/19/21 0902  SpO2 100 % 06/19/21 0902  Vitals shown include unvalidated device data.  Last Pain:  Vitals:   06/19/21 0559  TempSrc:   PainSc: 0-No pain         Complications: No notable events documented.

## 2021-06-19 NOTE — Discharge Instructions (Signed)
[facility] ______[facility] LAPAROSCOPIC SURGERY: POST OP INSTRUCTIONS Always review your discharge instruction sheet given to you by the facility where your surgery was performed. IF YOU HAVE DISABILITY OR FAMILY LEAVE FORMS, YOU MUST BRING THEM TO THE OFFICE FOR PROCESSING.   DO NOT GIVE THEM TO YOUR DOCTOR.  A prescription for pain medication may be given to you upon discharge.  Take your pain medication as prescribed, if needed.  If narcotic pain medicine is not needed, then you may take acetaminophen (Tylenol) or ibuprofen (Advil) as needed. Take your usually prescribed medications unless otherwise directed. If you need a refill on your pain medication, please contact your pharmacy.  They will contact our office to request authorization. Prescriptions will not be filled after 5pm or on week-ends. You should follow a light diet the first few days after arrival home, such as soup and crackers, etc.  Be sure to include lots of fluids daily. Most patients will experience some swelling and bruising in the area of the incisions.  Ice packs will help.  Swelling and bruising can take several days to resolve.  It is common to experience some constipation if taking pain medication after surgery.  Increasing fluid intake and taking a stool softener (such as Colace) will usually help or prevent this problem from occurring.  A mild laxative (Milk of Magnesia or Miralax) should be taken according to package instructions if there are no bowel movements after 48 hours. Unless discharge instructions indicate otherwise, you may remove your bandages 24-48 hours after surgery, and you may shower at that time.  You may have steri-strips (small skin tapes) in place directly over the incision.  These strips should be left on the skin for 7-10 days.  If your surgeon used skin glue on the incision, you may shower in 24 hours.  The glue will flake off over the next 2-3 weeks.  Any sutures or staples will be  removed at the office during your follow-up visit. ACTIVITIES:  You may resume regular (light) daily activities beginning the next day--such as daily self-care, walking, climbing stairs--gradually increasing activities as tolerated.  You may have sexual intercourse when it is comfortable.  Refrain from any heavy lifting or straining until approved by your doctor. You may drive when you are no longer taking prescription pain medication, you can comfortably wear a seatbelt, and you can safely maneuver your car and apply brakes. RETURN TO WORK:  __________________________________________________________ You should see your doctor in the office for a follow-up appointment approximately 2-3 weeks after your surgery.  Make sure that you call for this appointment within a day or two after you arrive home to insure a convenient appointment time. OTHER INSTRUCTIONS: __________________________________________________________________________________________________________________________ __________________________________________________________________________________________________________________________ WHEN TO CALL YOUR DOCTOR: Fever over 101.0 Inability to urinate Continued bleeding from incision. Increased pain, redness, or drainage from the incision. Increasing abdominal pain  The clinic staff is available to answer your questions during regular business hours.  Please don't hesitate to call and ask to speak to one of the nurses for clinical concerns.  If you have a medical emergency, go to the nearest emergency room or call 911.  A surgeon from [facility] is always on call at the hospital. [address], [address], [place], [place]  [address] ? [address] [address], [place], [place]   [address] [phone] ? [phone] ? FAX [phone] Web site: [email or web address]  

## 2021-06-19 NOTE — H&P (Signed)
History of Present Illness: Ian Ramirez is a 51 y.o. male who is seen today for gallstone and gallbladder follow-up. He was seen last year but wanted to wait on surgery. He developed a bout of gallstone pancreatitis about 2 months ago and briefly hospitalized. He is resolved but is ready to have surgery. He has had no more attacks since the end of July..  Review of Systems: A complete review of systems was obtained from the patient. I have reviewed this information and discussed as appropriate with the patient. See HPI as well for other ROS.    Medical History: History reviewed. No pertinent past medical history.  There is no problem list on file for this patient.  History reviewed. No pertinent surgical history.   No Known Allergies  No current outpatient medications on file prior to visit.   No current facility-administered medications on file prior to visit.   Family History  Family history unknown: Yes    Social History   Tobacco Use  Smoking Status Never Smoker  Smokeless Tobacco Never Used    Social History   Socioeconomic History   Marital status: Married  Tobacco Use   Smoking status: Never Smoker   Smokeless tobacco: Never Used  Substance and Sexual Activity   Alcohol use: Never   Drug use: Never   Objective:   Vitals:  03/04/21 0956  BP: 120/76  Pulse: 77  Temp: 36.8 C (98.2 F)  SpO2: 99%  Weight: 77.4 kg (170 lb 9.6 oz)  Height: 175.3 cm (5\' 9" )   Body mass index is 25.19 kg/m.  Physical Exam Constitutional:  Appearance: Normal appearance.  Eyes:  General: No scleral icterus. Pupils: Pupils are equal, round, and reactive to light.  Cardiovascular:  Rate and Rhythm: Normal rate.  Pulmonary:  Effort: Pulmonary effort is normal.  Breath sounds: No stridor.  Abdominal:  General: There is no distension.  Tenderness: There is no abdominal tenderness. There is no guarding.  Musculoskeletal:  Cervical back: Normal range of motion.   Skin: General: Skin is warm.  Neurological:  Mental Status: He is alert.  Psychiatric:  Mood and Affect: Mood normal.  Behavior: Behavior normal.     Labs, Imaging and Diagnostic Testing: Results for IMRI, LOR (MRN [id number]) as of 03/04/2021 10:16 Ref. Range 12/25/2020 15:53  Sodium Latest Ref Range: 135 - 145 mEq/L 139  Potassium Latest Ref Range: 3.5 - 5.1 mEq/L 4.2  Chloride Latest Ref Range: 96 - 112 mEq/L 104  CO2 Latest Ref Range: 19 - 32 mEq/L 27  Glucose Latest Ref Range: 70 - 99 mg/dL 92  BUN Latest Ref Range: 6 - 23 mg/dL 17  Creatinine Latest Ref Range: 0.40 - 1.50 mg/dL 12/27/2020  Calcium Latest Ref Range: 8.4 - 10.5 mg/dL 9.0  Alkaline Phosphatase Latest Ref Range: 39 - 117 U/L 64  Albumin Latest Ref Range: 3.5 - 5.2 g/dL 4.0  Amylase, Serum Latest Ref Range: 27 - 131 U/L 43  Lipase Latest Ref Range: 11.0 - 59.0 U/L 127.0 (H)  AST Latest Ref Range: 0 - 37 U/L 21  ALT Latest Ref Range: 0 - 53 U/L 36  Total Protein Latest Ref Range: 6.0 - 8.3 g/dL 6.9  Total Bilirubin Latest Ref Range: 0.2 - 1.2 mg/dL 0.5  GFR Latest Ref Range: >60.00 mL/min 104.80  WBC Latest Ref Range: 4.0 - 10.5 K/uL 10.5  RBC Latest Ref Range: 4.22 - 5.81 Mil/uL 4.53  Hemoglobin Latest Ref Range: 13.0 - 17.0 g/dL 13.0  HCT Latest Ref Range: 39.0 - 52.0 % 38.0 (L)  MCV Latest Ref Range: 78.0 - 100.0 fl 83.8  MCHC Latest Ref Range: 30.0 - 36.0 g/dL 16.1  RDW Latest Ref Range: 11.5 - 15.5 % 12.1  Platelets Latest Ref Range: 150.0 - 400.0 K/uL 349.0  Neutrophils Latest Ref Range: 43.0 - 77.0 % 72.1  Lymphocytes Latest Ref Range: 12.0 - 46.0 % 14.5  Monocytes Relative Latest Ref Range: 3.0 - 12.0 % 8.8  Eosinophil Latest Ref Range: 0.0 - 5.0 % 3.9  Basophil Latest Ref Range: 0.0 - 3.0 % 0.7  NEUT# Latest Ref Range: 1.4 - 7.7 K/uL 7.6  Lymphocyte # Latest Ref Range: 0.7 - 4.0 K/uL 1.5  Monocyte # Latest Ref Range: 0.1 - 1.0 K/uL 0.9  Eosinophils Absolute Latest Ref Range: 0.0 - 0.7 K/uL 0.4   Basophils Absolute Latest Ref Range: 0.0 - 0.1 K/uL 0.1   Assessment and Plan:  Diagnoses and all orders for this visit:  Calculus of gallbladder with chronic cholecystitis without obstruction  Gallstone pancreatitis    Patient is ready to schedule surgery. He had a bout of gallstone pancreatitis at the end of July and was briefly hospitalized.  Discussed surgery at great detail today. Discussed the pros and cons of surgery as well as long-term expectations and potential complications. Complications include but not exclusive of bleeding, infection, common bile duct injury, bile leak requiring additional surgery and/or procedures, bowel injury, liver injury, stomach injury, death, DVT, cardiovascular event, the need for more treatments, surgery and/or procedures and dietary changes if necessary. We discussed the potential for diarrhea. We discussed potential medical management options which in his case are minimal. He is has agreed to proceed with surgery. No follow-ups on file.  Hayden Rasmussen, MD

## 2021-06-19 NOTE — Anesthesia Postprocedure Evaluation (Signed)
Anesthesia Post Note  Patient: Ian Ramirez  Procedure(s) Performed: LAPAROSCOPIC CHOLECYSTECTOMY WITH INTRAOPERATIVE CHOLANGIOGRAM     Patient location during evaluation: PACU Anesthesia Type: General Level of consciousness: awake and alert Pain management: pain level controlled Vital Signs Assessment: post-procedure vital signs reviewed and stable Respiratory status: spontaneous breathing, nonlabored ventilation and respiratory function stable Cardiovascular status: stable and blood pressure returned to baseline Anesthetic complications: no   No notable events documented.  Last Vitals:  Vitals:   06/19/21 0930 06/19/21 0945  BP: (!) 105/58 121/72  Pulse: (!) 47 63  Resp: 11 18  Temp:  (!) 36.4 C  SpO2: 100% 100%    Last Pain:  Vitals:   06/19/21 0559  TempSrc:   PainSc: 0-No pain                 Beryle Lathe

## 2021-06-20 ENCOUNTER — Encounter (HOSPITAL_COMMUNITY): Payer: Self-pay | Admitting: Surgery

## 2021-06-20 LAB — SURGICAL PATHOLOGY

## 2021-06-30 ENCOUNTER — Encounter (HOSPITAL_COMMUNITY): Payer: Self-pay | Admitting: Surgery

## 2021-10-28 ENCOUNTER — Telehealth: Payer: Self-pay | Admitting: Medical

## 2021-10-28 ENCOUNTER — Encounter: Payer: Self-pay | Admitting: Family Medicine

## 2021-10-28 ENCOUNTER — Ambulatory Visit: Payer: BC Managed Care – PPO | Admitting: Family Medicine

## 2021-10-28 VITALS — BP 108/60 | HR 90 | Temp 99.7°F | Ht 69.0 in | Wt 186.2 lb

## 2021-10-28 DIAGNOSIS — L03115 Cellulitis of right lower limb: Secondary | ICD-10-CM | POA: Diagnosis not present

## 2021-10-28 MED ORDER — DOXYCYCLINE HYCLATE 100 MG PO TABS: 100.0000 mg | ORAL_TABLET | Freq: Two times a day (BID) | ORAL | 0 refills | Status: AC

## 2021-10-28 NOTE — Progress Notes (Signed)
Chief Complaint  Patient presents with   Insect Bite    Got the bite on Friday night    Ian Ramirez is a 51 y.o. male here for a skin complaint.  Duration: 4 days Location: R thigh posteriorly Pruritic? No Painful? Yes Drainage? No New soaps/lotions/topicals/detergents? No Sick contacts? No Other associated symptoms: subjective fevers, bitten by a spider Therapies tried thus far: topical herbs  Past Medical History:  Diagnosis Date   Medical history non-contributory     BP 108/60   Pulse 90   Temp 99.7 F (37.6 C) (Oral)   Ht 5\' 9"  (1.753 m)   Wt 186 lb 4 oz (84.5 kg)   SpO2 97%   BMI 27.50 kg/m  Gen: awake, alert, appearing stated age Lungs: No accessory muscle use Skin: Over proximal right thigh posteriorly, there is an excoriated center with surrounding erythema that is indurated.  A mix of purulent/bloody discharge is able to be expressed from the opening centrally.  It is very tender to palpation and warm compared to surrounding structures.  Entire diameter is around 13 cm. Psych: Age appropriate judgment and insight  Cellulitis of right lower extremity - Plan: doxycycline (VIBRA-TABS) 100 MG tablet  Given the induration, do not think he needs I&D, 7 days of doxycycline.  Avoid UV light.  He will let me know if anything changes. F/u prn. The patient voiced understanding and agreement to the plan.  [place], DO 10/28/21 4:34 PM

## 2021-10-28 NOTE — Telephone Encounter (Signed)
Initial Comment Has bug bite from the weekend. Has runny nose, cough, slight fever. bite is on back of leg that is sore and puffy. Has appt for today. Translation No Disp. Time Lamount Cohen Time) Disposition Final User 10/28/2021 9:24:02 AM Attempt made - message left Judene Companion, Elvin So 10/28/2021 9:42:08 AM Attempt made - no message left Judene Companion, Elvin So 10/28/2021 9:55:16 AM FINAL ATTEMPT MADE - no message left Yes Lane Hacker, RN, Elvin So

## 2021-10-28 NOTE — Telephone Encounter (Signed)
Pt states he has a bug bit about the size of a quarter and wanted to schedule an appt. His sxs include mild fever/ chills, headache, and slight lightheadedness. Scheduled him today with [facility] and transferred to triage.

## 2021-10-28 NOTE — Telephone Encounter (Signed)
Pt scheduled today with wendling

## 2021-10-28 NOTE — Patient Instructions (Addendum)
Ice/cold pack over area for 10-15 min twice daily.  OK to take Tylenol 1000 mg (2 extra strength tabs) or 975 mg (3 regular strength tabs) every 6 hours as needed.  Warm compresses can help things drain.   Ibuprofen 400-600 mg (2-3 over the counter strength tabs) every 6 hours as needed for pain.  Let me know if anything new arises.   Do not shower for the rest of the day. When you do wash it, use only soap and water. Do not vigorously scrub. Keep the area clean and dry.   Things to look out for: increasing pain not relieved by ibuprofen/acetaminophen, fevers, spreading redness, increased drainage of pus, or foul odor.  Let us know if you need anything.

## 2022-02-27 ENCOUNTER — Ambulatory Visit
Admission: EM | Admit: 2022-02-27 | Discharge: 2022-02-27 | Disposition: A | Discharge status: Home / Self Care | Payer: BC Managed Care – PPO | Attending: Urgent Care | Admitting: Urgent Care

## 2022-02-27 DIAGNOSIS — U071 COVID-19: Secondary | ICD-10-CM | POA: Insufficient documentation

## 2022-02-27 MED ORDER — PROMETHAZINE-DM 6.25-15 MG/5ML PO SYRP: 2.5000 mL | ORAL_SOLUTION | Freq: Three times a day (TID) | ORAL | 0 refills | Status: AC | PRN

## 2022-02-27 NOTE — Discharge Instructions (Signed)

## 2022-02-27 NOTE — ED Provider Notes (Signed)
[facility]  Note:  This document was prepared using Systems analyst and may include unintentional dictation errors.  MRN: [id number] DOB: 10-Sep-1970  Subjective:   Ian Ramirez is a 51 y.o. male presenting for 4-day history of acute onset persistent coughing, sinus drainage.  Symptoms are mild.  He tested positive for COVID at home and has been required by his employer to present to our clinic for PCR test.  Needs a note for his work.  No chest pain, shortness of breath, fever, body pains.  Patient is not a smoker.  No chronic conditions.  No current facility-administered medications for this encounter. No current outpatient medications on file.   No Known Allergies  Past Medical History:  Diagnosis Date   Medical history non-contributory      Past Surgical History:  Procedure Laterality Date   CHOLECYSTECTOMY N/A 06/19/2021   Procedure: LAPAROSCOPIC CHOLECYSTECTOMY WITH INTRAOPERATIVE CHOLANGIOGRAM;  Surgeon: Erroll Luna, MD;  Location: [facility];  Service: General;  Laterality: N/A;   WISDOM TOOTH EXTRACTION      Family History  Problem Relation Age of Onset   Colon cancer Neg Hx    Colon polyps Neg Hx    Esophageal cancer Neg Hx    Rectal cancer Neg Hx    Stomach cancer Neg Hx     Social History   Tobacco Use   Smoking status: Never   Smokeless tobacco: Never  Vaping Use   Vaping Use: Never used  Substance Use Topics   Alcohol use: Yes    Comment: 3 to 4 times week   Drug use: Never    ROS   Objective:   Vitals: BP 114/69 (BP Location: Left Arm)   Pulse 71   Temp 98.4 F (36.9 C) (Oral)   Resp 16   SpO2 97%   Physical Exam Constitutional:      General: He is not in acute distress.    Appearance: Normal appearance. He is well-developed. He is not ill-appearing, toxic-appearing or diaphoretic.  HENT:     Head: Normocephalic and atraumatic.     Right Ear: External ear normal.     Left Ear: External ear  normal.     Nose: Nose normal.     Mouth/Throat:     Mouth: Mucous membranes are moist.  Eyes:     General: No scleral icterus.       Right eye: No discharge.        Left eye: No discharge.     Extraocular Movements: Extraocular movements intact.  Cardiovascular:     Rate and Rhythm: Normal rate and regular rhythm.     Heart sounds: Normal heart sounds. No murmur heard.    No friction rub. No gallop.  Pulmonary:     Effort: Pulmonary effort is normal. No respiratory distress.     Breath sounds: Normal breath sounds. No stridor. No wheezing, rhonchi or rales.  Neurological:     Mental Status: He is alert and oriented to person, place, and time.  Psychiatric:        Mood and Affect: Mood normal.        Behavior: Behavior normal.        Thought Content: Thought content normal.     Assessment and Plan :   PDMP not reviewed this encounter.  1. Clinical diagnosis of COVID-19    Deferred imaging given clear cardiopulmonary exam, hemodynamically stable vital signs.  Patient does not really need COVID antivirals. Will  manage COVID-19 with supportive care. Counseled patient on nature of COVID-19 including modes of transmission, diagnostic testing, management and supportive care.  Offered symptomatic relief. Counseled patient on potential for adverse effects with medications prescribed/recommended today, strict ER and return-to-clinic precautions discussed, patient verbalized understanding.     Jaynee Eagles, [place] 02/27/22 [phone]

## 2022-02-27 NOTE — ED Triage Notes (Signed)
Tested positive for COVID last pm. Pt states that he has to have a COVID test from a doctor in order to get COVID pay. Pt states he has a mild cough and nasal drainage. SX started on Tuesday.

## 2022-02-28 LAB — SARS CORONAVIRUS 2 (TAT 6-24 HRS): SARS Coronavirus 2: POSITIVE — AB

## 2022-05-08 DIAGNOSIS — F439 Reaction to severe stress, unspecified: Secondary | ICD-10-CM | POA: Diagnosis not present

## 2022-05-08 DIAGNOSIS — R748 Abnormal levels of other serum enzymes: Secondary | ICD-10-CM | POA: Diagnosis not present

## 2022-05-08 DIAGNOSIS — R0609 Other forms of dyspnea: Secondary | ICD-10-CM | POA: Diagnosis not present

## 2022-05-08 DIAGNOSIS — R0602 Shortness of breath: Secondary | ICD-10-CM | POA: Diagnosis not present

## 2022-05-08 DIAGNOSIS — Z13220 Encounter for screening for lipoid disorders: Secondary | ICD-10-CM | POA: Diagnosis not present

## 2022-05-08 DIAGNOSIS — Z125 Encounter for screening for malignant neoplasm of prostate: Secondary | ICD-10-CM | POA: Diagnosis not present

## 2022-05-08 DIAGNOSIS — N528 Other male erectile dysfunction: Secondary | ICD-10-CM | POA: Diagnosis not present

## 2022-05-08 DIAGNOSIS — R42 Dizziness and giddiness: Secondary | ICD-10-CM | POA: Diagnosis not present

## 2022-05-08 DIAGNOSIS — D649 Anemia, unspecified: Secondary | ICD-10-CM | POA: Diagnosis not present

## 2022-06-09 DIAGNOSIS — R748 Abnormal levels of other serum enzymes: Secondary | ICD-10-CM | POA: Diagnosis not present

## 2022-06-09 DIAGNOSIS — D649 Anemia, unspecified: Secondary | ICD-10-CM | POA: Diagnosis not present

## 2022-06-09 DIAGNOSIS — R7989 Other specified abnormal findings of blood chemistry: Secondary | ICD-10-CM | POA: Diagnosis not present

## 2022-06-09 DIAGNOSIS — Z0000 Encounter for general adult medical examination without abnormal findings: Secondary | ICD-10-CM | POA: Diagnosis not present

## 2022-06-09 DIAGNOSIS — N528 Other male erectile dysfunction: Secondary | ICD-10-CM | POA: Diagnosis not present

## 2022-06-09 DIAGNOSIS — Z23 Encounter for immunization: Secondary | ICD-10-CM | POA: Diagnosis not present

## 2022-08-06 DIAGNOSIS — J029 Acute pharyngitis, unspecified: Secondary | ICD-10-CM | POA: Diagnosis not present

## 2022-08-06 DIAGNOSIS — Z20828 Contact with and (suspected) exposure to other viral communicable diseases: Secondary | ICD-10-CM | POA: Diagnosis not present

## 2022-08-06 DIAGNOSIS — J020 Streptococcal pharyngitis: Secondary | ICD-10-CM | POA: Diagnosis not present

## 2024-07-21 ENCOUNTER — Encounter: Admitting: Nurse Practitioner
